# Patient Record
Sex: Male | Born: 1935 | Race: White | Hispanic: No | Marital: Married | Smoking: Former smoker
Health system: Southern US, Community
[De-identification: ages and names within clinical notes are randomized; demographics above are authoritative.]

## PROBLEM LIST (undated history)

## (undated) DIAGNOSIS — M1990 Unspecified osteoarthritis, unspecified site: Secondary | ICD-10-CM

## (undated) DIAGNOSIS — I10 Essential (primary) hypertension: Secondary | ICD-10-CM

## (undated) DIAGNOSIS — E119 Type 2 diabetes mellitus without complications: Secondary | ICD-10-CM

## (undated) DIAGNOSIS — E785 Hyperlipidemia, unspecified: Secondary | ICD-10-CM

## (undated) DIAGNOSIS — D649 Anemia, unspecified: Secondary | ICD-10-CM

## (undated) DIAGNOSIS — I2510 Atherosclerotic heart disease of native coronary artery without angina pectoris: Secondary | ICD-10-CM

## (undated) DIAGNOSIS — I219 Acute myocardial infarction, unspecified: Secondary | ICD-10-CM

## (undated) HISTORY — DX: Atherosclerotic heart disease of native coronary artery without angina pectoris: I25.10

## (undated) HISTORY — DX: Hyperlipidemia, unspecified: E78.5

## (undated) HISTORY — PX: BACK SURGERY: SHX140

## (undated) HISTORY — PX: APPENDECTOMY: SHX54

## (undated) HISTORY — PX: KNEE SURGERY: SHX244

---

## 1990-12-04 HISTORY — PX: CORONARY ARTERY BYPASS GRAFT: SHX141

## 2002-12-04 HISTORY — PX: CARDIAC CATHETERIZATION: SHX172

## 2002-12-07 ENCOUNTER — Inpatient Hospital Stay (HOSPITAL_COMMUNITY): Admission: EM | Admit: 2002-12-07 | Discharge: 2002-12-11 | Payer: Self-pay | Admitting: Emergency Medicine

## 2002-12-08 ENCOUNTER — Encounter: Payer: Self-pay | Admitting: Internal Medicine

## 2003-02-04 ENCOUNTER — Inpatient Hospital Stay (HOSPITAL_COMMUNITY): Admission: EM | Admit: 2003-02-04 | Discharge: 2003-02-06 | Payer: Self-pay | Admitting: *Deleted

## 2003-02-12 ENCOUNTER — Ambulatory Visit (HOSPITAL_COMMUNITY): Admission: RE | Admit: 2003-02-12 | Discharge: 2003-02-13 | Payer: Self-pay | Admitting: Cardiology

## 2009-12-31 ENCOUNTER — Ambulatory Visit: Payer: Self-pay | Admitting: Cardiovascular Disease

## 2010-01-03 ENCOUNTER — Telehealth: Payer: Self-pay | Admitting: Cardiovascular Disease

## 2010-02-07 ENCOUNTER — Inpatient Hospital Stay (HOSPITAL_COMMUNITY): Admission: RE | Admit: 2010-02-07 | Discharge: 2010-02-11 | Payer: Self-pay | Admitting: Neurosurgery

## 2010-03-31 ENCOUNTER — Encounter: Admission: RE | Admit: 2010-03-31 | Discharge: 2010-03-31 | Payer: Self-pay | Admitting: Neurosurgery

## 2010-06-07 ENCOUNTER — Encounter: Admission: RE | Admit: 2010-06-07 | Discharge: 2010-06-07 | Payer: Self-pay | Admitting: Neurosurgery

## 2010-06-27 ENCOUNTER — Ambulatory Visit: Payer: Self-pay | Admitting: Surgery

## 2011-01-03 NOTE — Progress Notes (Signed)
  Phone Note Outgoing Call   Call placed by: Jennifer Toothman LPN Call placed to: Patient Summary of Call: Patient notified per Dr. Ellyn Rubiano, lab results were OK.    

## 2011-02-26 LAB — CBC
HCT: 47.7 % (ref 39.0–52.0)
Hemoglobin: 16.2 g/dL (ref 13.0–17.0)
MCHC: 34 g/dL (ref 30.0–36.0)
MCV: 80.3 fL (ref 78.0–100.0)
RDW: 16.6 % — ABNORMAL HIGH (ref 11.5–15.5)

## 2011-02-26 LAB — BASIC METABOLIC PANEL
BUN: 24 mg/dL — ABNORMAL HIGH (ref 6–23)
CO2: 31 mEq/L (ref 19–32)
Calcium: 8.4 mg/dL (ref 8.4–10.5)
Chloride: 99 mEq/L (ref 96–112)
GFR calc Af Amer: >60 mL/min (ref >60)
GFR calc non Af Amer: >60 mL/min (ref >60)
Glucose, Bld: 132 mg/dL — ABNORMAL HIGH (ref 70–99)
Glucose, Bld: 143 mg/dL — ABNORMAL HIGH (ref 70–99)
Potassium: 3.8 mEq/L (ref 3.5–5.1)

## 2011-02-26 LAB — PROTIME-INR
INR: 1.03 (ref 0.00–1.49)
Prothrombin Time: 13.4 seconds (ref 11.6–15.2)

## 2011-02-26 LAB — SURGICAL PCR SCREEN: Staphylococcus aureus: NEGATIVE

## 2011-02-26 LAB — APTT: aPTT: 32 seconds (ref 24–37)

## 2011-02-26 LAB — TYPE AND SCREEN

## 2011-04-18 NOTE — Assessment & Plan Note (Signed)
[facility]                        [facility] OFFICE NOTE   NAME:[name], [name]                        MRN:          [id number]  DATE:12/31/2009                            DOB:          04-Apr-1935    CHIEF COMPLAINT:  Preoperative clearance for back surgery.   HISTORY OF PRESENT ILLNESS:  [name] is a 76 year old white male with  past medical history significant for coronary disease status post CABG  in 1992 and multiple subsequent PCIs in 2004, hypertension,  hyperlipidemia, who is presenting for a preoperative evaluation prior to  lumbar surgery to address spinal stenosis.  The patient states since his  last coronary interventions in 2004, he has been getting along quite  well.  Over the past year, he has had worsening lower back and left  lower extremity pain that was eventually diagnosed as being caused by  spinal stenosis. The patient states he has been very sedentary secondary  to the back and lower extremity pain.  However, he denies any episodes  of chest discomfort, shortness of breath, lower extremity edema,  dizziness or syncope.  The patient does state that for the past 2 weeks  he has had cold-like symptoms including an infrequent cough of brownish  sputum.  He has been compliant with all of his medications.   PAST MEDICAL HISTORY:  As above in HPI.  In addition, the patient has a  spinal stenosis.   SOCIAL HISTORY:  Quit using tobacco in 1991.  No alcohol.   FAMILY HISTORY:  Negative premature coronary disease.   ALLERGIES:  He states intolerance to all STATINS.   MEDICATIONS:  1. Aspirin 325 daily.  2. Carvedilol 3.125 b.i.d.  3. Hydrochlorothiazide 25 mg daily.  4. Fish oil.  5. Cinnamon.  6. Gabapentin.  7. Garlic.   REVIEW OF SYSTEMS:  As in HPI. The patient states that the pain in his  legs, especially his left leg has been very severe.  Other systems as in  HPI, otherwise negative.   PHYSICAL EXAMINATION:   VITAL SIGNS:  The patient's blood pressure is  136/65, pulse is 57, sating 98% on room air, and he weighs 246 pounds.  GENERAL:  No acute distress.  HEENT:  Normocephalic and atraumatic.  NECK:  Supple.  There is no JVD in the seated position.  There are no  carotid bruits.  HEART:  Regular rate and rhythm without murmur, rub, or gallop.  LUNGS:  Clear bilaterally.  ABDOMEN:  Soft, nontender, and nondistended.  EXTREMITIES:  Without edema.  SKIN:  Warm and dry.  PSYCHIATRIC:  The patient is appropriate, but appears somewhat anxious  about the level of pain he is experiencing and the rapidity with the  surgery that we performed.   LABORATORY DATA:  EKG taken today in clinic independently reviewed by  myself demonstrates normal sinus rhythm with a right bundle-branch  block.  There is ST-segment depression in II and AVF as well as V5 and  V6.  This depression occurs mainly within 80-120 msec after the J-point  and T-waves are upright.  This is new compared  with the EKG dated March  2004.  Review of the patient's ABI is dated May 2010, right ABI was  1.13, left ABI was 1.02.   ASSESSMENT:  A 77 year old white male with known coronary disease who is  presenting for preoperative clearance.  He does not appear to be having  any symptoms of angina.  However, his EKG is somewhat concerning for  ischemia as he does have significant ST depression, 80 msec after the J-  point.   PLAN:  We will proceed with a Lexiscan Cardiolite in order to help risk  stratify the patient for his cardiovascular procedure.  He should  continue on his current medications.  His blood pressure is under good  control and his cholesterol is being followed by Dr. Modesto Charon.  We  will contact the patient once the results of his stress test are  obtained as well as sending a letter to Dr. Wynetta Emery after contacting the  patient.     Brayton El, MD  Electronically Signed    SGA/MedQ  DD: 12/31/2009  DT:  01/01/2010  Job #: [id number]

## 2011-04-18 NOTE — Assessment & Plan Note (Signed)
OFFICE VISIT   [name], [name]  DOB:  10-Apr-1935                                       06/27/2010  XBJYN#:[id number]   REASON FOR VISIT:  Left ankle and foot pain.   HISTORY:  This is a 76 year old gentleman that I am seeing at the  request of Dr. Wynetta Emery for evaluation of left ankle and foot pain.  The  patient states that he has been having pain in his left foot and ankle  for at least 5 years.  He describes it as a sharp pain which begins in  his ankle and shoots out to his foot and toes.  He states that his ankle  and foot are numb all the time.  Walking does make it worse.  He did  have a lumbar back injection in August, and he said it temporarily went  away.  He is status post lumbar back surgery on March 7th which  alleviated the radiculopathy and pelvic and thigh pain; however, he  still is left with residual ankle and foot pain.  The patient does not  endorse any ulcers or nonhealing wounds.   The patient suffers from hypertension, hypercholesterolemia, both of  which are medically managed.  He has had a heart attack in 2005 and has  had coronary stents the past.  He has also had a coronary bypass graft.   REVIEW OF SYSTEMS:  GENERAL:  Negative for fevers, chills, weight gain,  weight loss.  VASCULAR:  Positive for pain in the legs with walking and clots in his  veins.  CARDIAC:  Negative.  GI:  Negative.  NEURO:  Is as above.  PULMONARY:  Negative.  HEME:  Negative.  GU:  Negative.  ENT:  Negative.  MUSCULOSKELETAL:  Positive for arthritis, joint pain and muscle pain.  PSYCH:  Negative.  SKIN:  Negative.   PAST MEDICAL HISTORY:  Hypertension, hypercholesterolemia, history of MI  in 2005.   PAST SURGICAL HISTORY:  CABG, knee replacement, coronary stents and back  surgery.   SOCIAL HISTORY:  He is married with 2 children.  He is retired.  History  of smoking, quit in October 1991.  Does not drink.   ALLERGIES:  See chart.   MEDICATIONS:  See chart.   PHYSICAL EXAMINATION:  Heart rate 56, blood pressure 132/60, O2 sat 90%.  General:  He is well-appearing, in no distress.  HEENT:  Within normal  limits.  Lungs:  Clear bilaterally.  Cardiovascular:  Regular rate and  rhythm.  No carotid bruits.  Musculoskeletal:  No cyanosis.  Neuro:  No  focal deficits.  Skin:  Without rash.  Extremities:  The patient has 2+  right femoral pulse, 1+ left femoral pulse.  He has palpable dorsalis  pedis and posterior tibial arteries on the left.   DIAGNOSTIC STUDIES:  CT scan performed reveals a 3.2-cm infrarenal  aortic aneurysm and a left common iliac dissection.   Duplex study:  The ankle-brachial index on the right is 1.0, on the left  is 0.9.  Monophasic waveforms on the left.   ASSESSMENT/PLAN:  Abdominal aortic aneurysm and left iliac dissection.   Based on the patient's symptomatology and findings of dissection on CT  scan, I do not feel that his dissection is in any way contributing to  his complaints.  His complaints go more along the  lines of arthritic or  neuropathic.  In addition, he has palpable pedal pulses.  I have  discussed these findings with the patient and have reassured him that  his symptoms are not due to arterial pathology.  He does have an  abdominal aortic aneurysm which will need to be followed on a yearly  basis.  I have scheduled him to come back to see me in 1 year with an  abdominal ultrasound.     Jorge Ny, MD  Electronically Signed   VWB/MEDQ  D:  06/27/2010  T:  06/28/2010  Job:  [id number]   cc:   Donalee Citrin, M.D.

## 2011-04-21 NOTE — Cardiovascular Report (Signed)
NAMEJAHMAD, [name]                           ACCOUNT NO.:  [id number]   MEDICAL RECORD NO.:  [id number]                   PATIENT TYPE:  INP   LOCATION:  2926                                 FACILITY:  [facility]   PHYSICIAN:  Charlies Constable, M.D. [facility]              DATE OF BIRTH:  [birth year]   DATE OF PROCEDURE:  12/08/2002  DATE OF DISCHARGE:                              CARDIAC CATHETERIZATION   CLINICAL HISTORY:  The patient is 76 years old and had bypass surgery  performed at [facility] 12 years ago and has not had any follow-up since that  time.  He presented to [facility] with chest pain, ST-T changes, and  enzymes consistent with a non-Q-wave myocardial infarction.  He was started  on Integrilin and heparin and transferred to [place] for further evaluation and  treatment.  His platelet count was 125,000 on admission to [facility]  and was 93,000 this morning prior to the procedure.   PROCEDURE:  The procedure was performed via the right femoral artery with  arterial sheath and 6 [place] ________ coronary catheters.  _____________ of  arterial branches was performed and nonopaque contrast was used.  After  completion of the diagnostic study, I made a decision to proceed with  multivessel coronary intervention.   Because of the platelet count was low and because of the possibility of  Integrilin-induced or heparin-induced thrombocytopenia, we made a decision  to stop the Integrilin and treated the patient with Angiomax bolus and  infusion.   We first approached the right coronary artery.  We used a JR4 6 [place]  guiding catheter with side holes and a short BNW wire.  We crossed the  lesion in the right coronary artery without difficulty.  We predilated with  a 2.5 x 20 mm Quantum Maverick, performing two inflations up to 10  atmospheres for 30 seconds.  We then deployed a 3.0 x 28 mm Cypher stent,  deploying this with one inflation of 12 atmospheres for 30 seconds and a  second inflation of 16 atmospheres for 30 seconds with the balloon inside  the distal edge.  We then post dilated with a 3.5 x 8 mm Quantum Maverick in  the proximal portion of the stent where we felt it was not completely  opposed and performed one inflation of 16 atmospheres for 30 seconds.   We then approached the circumflex artery.  There were two tandem lesions and  there was segmental disease which crossed sub-branches of the marginal  branch.  The second sub-branch had a 50% ostial lesion and came off of an  angle and we felt that stenting across this would probably compromise the  side branch and be difficult to treat.  Our initial approach was to try and  treat the more severe distal lesion.  We used a 2.5 x 20 mg Quantum Maverick  and performed one inflation of 10 atmospheres for  30 seconds and then we  pulled the balloon back and performed another inflation of 10 atmospheres  for 30 seconds which crossed the two sub-branches.  This resulted in  improvement, but there was a split distal to the two sub-branches and we  felt we had to stent the vessel.  We used a 2.25 x 20 mm express and  deployed this with one inflation of 10 atmospheres for 30 seconds and a  second inflation with the balloon inside the proximal edge at 14 atmospheres  for 30 seconds.  We then dilated the lesion just after the lesion of the  second sub-branch with the 2.25 balloon, performing one inflation of 10  atmospheres for 30 seconds.  Repeat diagnostic studies were then performed  through the guiding catheter.   The patient was extremely restless and agitated during the procedure because  of back pain.  He received a total of 200 mg of fentanyl and 3 mg of Versed.  Intermittently his saturations went down due to the sedation and we had to  arouse him.  This made the procedure extremely difficult and we did not feel  like we could do the saphenous vein graft at the same setting, so we elected  to defer this  to another time.   We closed the right femoral artery with Perclose at the end of the  procedure.   CONCLUSION:  1. Status post coronary artery bypass graft surgery 12 years ago at [facility]  2. Severe native vessel disease with total occlusion of the left anterior     descending, 95% stenosis in the proximal right coronary artery, 95%     stenosis in the marginal branch of the circumflex artery with 70%     stenosis in the mid portion of the marginal branch of the circumflex     artery, patent left internal mammary artery graft to the left anterior     descending, and an occluded vein graft to the diagonal branch of the left     anterior descending.  3. Anterolateral hypokinesis with overall good left ventricular function     with an ejection fraction of 50%.  4. Successful stenting of the proximal right coronary artery lesion with     improvement in narrowing from 95% to less than 10%.  5. Successful stent of the lesion in the distal circumflex marginal vessel     and improvement in narrowing from 95% to 0% and successful angioplasty of     the lesion in the mid portion of the marginal branch of the circumflex     artery, improving the narrowing from 70% to 40%.   DISPOSITION:  The patient was returned to ___________ for further  observation.  Will plan to bring the patient back in two days on Wednesday  for probable intervention on the vein graft to the diagonal branch depending  on its status at that time and relook at the circumflex artery.  We had  somewhat of a suboptimal result in the mid portion of the circumflex artery  just after the sub-branch, but we felt this was preferable at this time to  stenting across the sub-branch.                                               Charlies Constable, M.D. [facility]  BB/MEDQ  D:  12/08/2002  T:  12/08/2002  Job:  [id number]   cc:   [name], M.D. [facility]   Cardiopulmonary Lab

## 2011-04-21 NOTE — Letter (Signed)
January 10, 2010     RE:  [name], [name]  MRN:  [id number]  /  DOB:  04-26-1935   To Whom It May Concern:   This letter is regarding the preoperative evaluation of [name]  prior to lumbar fusion surgery.  [name] does have a history of  coronary disease but he is currently not having any symptoms that are  concerning for angina.  A nuclear stress test was performed and it only  showed a mild abnormality that may have been secondary to soft tissue  attenuation.  Overall this was a low risk study and the patient should  be considered medically optimized from a cardiac perspective for his  lumbar fusion surgery.  Please contact my office with any questions or  concerns.    Sincerely,      Brayton El, MD  Electronically Signed    SGA/MedQ  DD: 01/10/2010  DT: 01/10/2010  Job #: [id number]

## 2011-04-21 NOTE — H&P (Signed)
[name], [name]                           ACCOUNT NO.:  [id number]   MEDICAL RECORD NO.:  [id number]                   PATIENT TYPE:  INP   LOCATION:  2926                                 FACILITY:  [facility]   PHYSICIAN:  [name], M.D. [facility]           DATE OF BIRTH:  [birth year]   DATE OF ADMISSION:  12/07/2002  DATE OF DISCHARGE:                                HISTORY & PHYSICAL   Patient is currently without a room.   [name] is a 76 year old gentleman referred from [facility]  because of an acute, non-ST-segment-elevation myocardial infarction.   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  He has had  no significant intercurrent illnesses.  He did have a knee replacement done  two years ago.  He did not see a cardiologist at that time.   This evening, he awakened from sleep with severe chest pain described as  involving his right shoulder, the right side of his neck, into the right  side of his jaw associated with diaphoresis, shortness of breath and some  nausea.  He went to [facility] where an electrocardiogram  demonstrated ST-segment depression across his precordium and a segment of  right bundle branch block, left anterior fascicular block, and arrangements  were made for transfer.  He was treated initially with heparin and aspirin.  Integrelin was added.  He did receive Lopressor.  Severe hypertension noted  on arrival abated in conjunction with morphine, nitroglycerin and the beta  blockers.  This was associated with resolution of the ST-segment changes,  and en route he received further morphine with resolution of his chest pain.   He is limited to less than 100 or 200 feet because of shortness of breath.  He does not have claudication.  He does not have orthopnea, nocturnal  dyspnea or peripheral edema.  He has had no palpitations and no syncope.   He does not smoke.  He does not know if he has hypertension.  He does not  take cholesterol medication.   REVIEW OF SYSTEMS:  Noncontributory.  There have been no GI symptoms, no  constitutional symptoms.  He does have nocturnal cramping in his legs.   SOCIAL HISTORY:  He is married.  He lives with his grandson who assists him  in his work.  He does not use alcohol, cigarettes or recreational drugs.   SURGICAL HISTORY:  Notable for coronary artery bypass grafting and knee  replacement.   MEDICATIONS:  Aleve p.r.n.   ALLERGIES:  He has no known drug allergies.   PHYSICAL EXAMINATION:  GENERAL:  He is an older Caucasian male in no acute  distress.  VITAL SIGNS:  His blood pressure was 127/74 with a heart rate of 54.  His  respirations were 14.  He was in no acute distress.  HEENT:  His HEENT examination demonstrated no scleral icterus.  NECK:  The neck veins were flat.  The carotids were brisk and full  bilaterally without bruits.  There was no lymphadenopathy.  BACK:  The back was without kyphosis or scoliosis.  LUNGS:  The lungs were clear.  HEART:  The heart sounds were regular without murmurs or gallops.  ABDOMEN:  The abdomen was soft with active bowel sounds without midline  pulsation, hepatomegaly or tenderness.  EXTREMITIES:  The femoral pulses were 2+ bilaterally.  Distal pulses were  intact.  There was no clubbing, cyanosis or edema.  NEUROLOGIC:  His neurological examination was grossly normal.  SKIN:  His skin was warm and dry.   STUDIES:  An electrocardiogram at this point demonstrated sinus rhythm at 54  with intervals of 0.15/0.15/0.45 with an axis that was leftward at about -  30.  There was a QS in lead V1 and a rapid transition in lead II.  There was  no Q wave and the SG segment notable on the previous electrocardiograms had  resolved.   LABORATORY DATA FROM OUTSIDE HOSPITAL:   Creatinine 1.1.  Glucose 96.  Potassium 4.2.  CK was 320 with an MB of 14.2 and troponin of 2.1, both  elevated.   IMPRESSION:  1. Non-ST-segment-elevation myocardial infarction.  2. Known coronary artery disease     A. Status post coronary artery bypass grafting 12 years ago at [facility].     B. Unknown ejection fraction.  3. Class II-III congestive symptoms.  4. Wide QRS secondary to right bundle branch block with left axis deviation.  5. Status post knee replacement.   [name] has a non-ST-segment-elevation myocardial infarction in the  context of prior bypass surgery.  His pain is currently resolved with the  multiple anticoagulants that have been initiated.  Further antiplatelet  therapy will be started and risk factor modification will need to be  undertaken.  The fact that he is currently pain free will allow [place] to  undertake his catheterization more electively.  I reviewed this with him.   PLAN:  1. Continue his 2b3a, heparin and aspirin.  2. Begin Plavix.  3. Fasting lipid profile with initiation of Lipitor.  4. Catheterization in the A.M.  5. Begin statin therapy and ACE inhibitors.                                               [name], M.D. [facility]    SCK/MEDQ  D:  12/07/2002  T:  12/07/2002  Job:  [id number]

## 2011-04-21 NOTE — Discharge Summary (Signed)
[name], [name]                           ACCOUNT NO.:  [id number]   MEDICAL RECORD NO.:  [id number]                   PATIENT TYPE:  INP   LOCATION:  2036                                 FACILITY:  [facility]   PHYSICIAN:  [name], M.D. [facility]           DATE OF BIRTH:  01-29-1935   DATE OF ADMISSION:  12/07/2002  DATE OF DISCHARGE:  12/11/2002                           DISCHARGE SUMMARY - REFERRING   PROCEDURES:  1. Cardiac catheterization.  2. Coronary arteriogram.  3. Left ventriculogram.  4. Percutaneous transluminal coronary angiography and stent x 2.  5. Percutaneous transluminal coronary angiography and stent to vein graft.   HISTORY OF PRESENT ILLNESS:  The patient is a 76 year old male with a  history of aortocoronary bypass surgery 12 years ago at [facility].  He has not  had regular cardiology since that time.  On the day of admission, he was  awaken from sleep with severe chest pain that radiated into his right  shoulder, right neck, and right side of his jaw.  This was associated with  diaphoresis, shortness of breath, and nausea.  He went to [facility]  where his EKG was abnormal.  He was treated with heparin, Integrilin, and  Lopressor.  He was transferred to [facility] urgently  for cardiac catheterization and further evaluation.   HOSPITAL COURSE:  The patient had a cardiac catheterization on December 08, 2002, that showed a normal left main and an LAD that was totaled proximally.  The circumflex was patent, but the OM was a large vessel with a 70% mid and  then a 90% distal stenosis.  The RCA had a 95% stenosis as well.  His EF was  approximately 50%.  He had PTCA and stent to the OM, reducing the first  stenosis to approximately 40% with PTCA and then had a stent to the more  distal OM, reducing that stenosis from 95% to 0.  Additionally, he had PTCA  and stent to the RCA, reducing that stenosis from 95% to less than 10%.   The  LIMA to LAD was okay, but the SVG to diagonal was totaled.  Upon further  evaluation, it was felt that he would benefit from percutaneous intervention  to the diagonal, so this was scheduled for December 10, 2002.   The patient had percutaneous intervention to the SVG to diagonal, reducing  that stenosis from 100% to less than 10%.  He tolerated the procedure well,  was seen by cardiac rehabilitation, and ambulated without chest pain post  procedure.  His groin remained stable.  He had no significant problems with  this.  From a cardiac standpoint, he was doing well.   One of the issues that evolved during the patient's stay was  thrombocytopenia.  His platelets were slightly low at 125 upon arrival at  [facility].  However, with the medications he was given, his platelet  count was 93 upon arrival at [facility].  The heparin  was discontinued and antibody panel was sent.  His platelets did recover  somewhat and he was back up to what is apparently his baseline of 125 by the  time he left the hospital.  The platelet antibody study was negative.  He  had received aspirin, as well as heparin and Integrilin.   The patient had his aspirin reduced to 81 mg a day, but was continued on the  Plavix.  He did not receive any further heparin or Integrilin.  He was on  Angiomax between procedures.  He tolerated this well.  Any further  evaluation can be done as an outpatient.   With the improvement in the patient's cardiac status and with no other  medical problems, the patient was considered stable for discharge on December 11, 2002.   LABORATORY VALUES:  Hemoglobin 14.5, hematocrit 41.5, WBC 9.4, platelets 125  at discharge.  Sodium 135, potassium 4.1, chloride 101, CO2 25, BUN 18,  creatinine 1.1, glucose 104.  The CK-MB peaked at 419/29.7 with a troponin I  peak of 2.61.  Total cholesterol 154, triglycerides 402, HDL 25, LDL not  calculated.   CONDITION ON  DISCHARGE:  Improved.   DISCHARGE DIAGNOSES:  1. Non-ST segment elevation myocardial infarction, status post percutaneous     transluminal coronary angiography to the mid obtuse marginal,     percutaneous transluminal coronary angiography and Cypher stent to the     distal obtuse marginal with percutaneous transluminal coronary     angiography and Express stent to the right coronary artery, as well as     percutaneous transluminal coronary angiography and Express stent to the     saphenous vein graft to diagonal.  2. Status post aortocoronary bypass surgery in 1991 with left internal     mammary artery to left anterior descending and saphenous vein graft to     diagonal.  3. Thrombocytopenia.  Heparin antibody negative.  Patient also on Integrilin     this admission.  4. Mildly decreased left ventricular function with an ejection fraction of     50% by catheterization this admission.  5. Hypertension.  6. Dyslipidemia with hypertriglyceridemia and low high-density lipoprotein.  7. Status post knee replacement.  8. Status post percutaneous intervention on the left anterior descending in     1991.   ACTIVITY:  His activity level is to include no driving for a week and no  sexual or strenuous activity until cleared by M.D.   DIET:  He is to stick to a low-fat diet.   WOUND CARE:  He is to call the office for problems with the catheterization  site.   FOLLOW-UP:  He is to follow up with Harl Bowie, M.D., and see him within  two weeks.  He is to obtain a primary care physician.   DISCHARGE MEDICATIONS:  1. Plavix 81 mg q.d.  2. Plavix 75 mg q.d. for three months.  3.     TriCor 160 mg q.d.  4. Toprol XL 50 mg q.d.  5. Nitroglycerin p.r.n.     [name], P.A. [facility]                  [name], M.D. [facility]    RG/MEDQ  D:  12/11/2002  T:  12/11/2002  Job:  [id number]   cc:   Harl Bowie, M.D.  [address] [place]  [place] [address]  Fax: [phone]   Charlies Constable,  M.D. [facility]  [address]  [place]  [place] [address]

## 2011-04-21 NOTE — Discharge Summary (Signed)
[name], [name]                           ACCOUNT NO.:  [id number]   MEDICAL RECORD NO.:  [id number]                   PATIENT TYPE:  INP   LOCATION:  6523                                 FACILITY:  [facility]   PHYSICIAN:  Vida Roller, M.D.                DATE OF BIRTH:  17-Jul-1935   DATE OF ADMISSION:  02/04/2003  DATE OF DISCHARGE:  02/06/2003                           DISCHARGE SUMMARY - REFERRING   PROCEDURE:  1. Cardiac catheterization.  2. Coronary arteriogram.  3. Left ventriculogram.  4. Percutaneous transluminal coronary angioplasty and Cypher stent to one     vessel.   HOSPITAL COURSE:  [name] is a 76 year old male with known coronary  artery disease. He went to [facility] for evaluation of chest pain  and, because he had a history of recent stents to his RCA and OM as well as  left ventricular dysfunction, was transferred to [facility] for  further evaluation and treatment.   [name] enzymes were negative for MI. He had a cardiac catheterization  performed on 02/05/03. It showed a patent left main and a LAD that was  totalled in the proximal portion. The LIMA to the LAD was patent and  supplied a distal vessel. The SVG to diagonal was patent, and it had a 70%  stenosis but no restenosis at the previous intervention site. The circumflex  was patent, but the OM had 95% end stent restenosis. This was treated with  PTCA, and the stenosis was reduced to less than 30%. A branch of the obtuse  marginal had a 70% stenosis ostially, and the RCA had no end-stent  restenosis, but there was a 30% stenosis distal to the prior intervention  site. He had inferior hypokinesis with an EF of approximately 50%.   [name] tolerated the procedure well, and the sheath was removed without  difficulty. It was felt that he needed percutaneous intervention on the vein  graft to the diagonal but this could be done electively.   The next day, [name] was  without chest pain or shortness of breath, and  his groin was without hematoma or ecchymosis. He was seen by cardiac rehab  and ambulated, receiving restrictions on MI and percutaneous intervention.  He was walking independently without chest pain or shortness of breath and  considered stable for discharge on 02/06/03.   LABORATORY DATA:  Hemoglobin 14.8, hematocrit 42.7, WBCs 7.8, platelets 112.  Sodium 137, potassium 3.9, chloride 102, CO2 28, BUN 19, creatinine 1.3,  glucose 88.   DISCHARGE CONDITION:  Improved.   DISCHARGE DIAGNOSES:  1. Unstable anginal pain, status post percutaneous transluminal coronary     angioplasty to the obtuse marginal for end-stent restenosis this     admission.  2. History of aortocoronary bypass surgery at [facility] or 1991 with left     internal mammary artery and saphenous vein graft to diagonal.  3. Status post percutaneous transluminal coronary angioplasty and stent to     the right coronary artery and obtuse marginal in 1/04 secondary to     myocardial infarction.  4. History of percutaneous transluminal coronary angioplasty of the     saphenous vein graft to diagonal 1/04.  5. History of thrombocytopenia.  6. Hypertension.  7. Hyperlipidemia.  8. Status post left knee replacement 2002.  9. Rheumatoid arthritis.   DISCHARGE INSTRUCTIONS:  Activity level is to include no strenuous activity  until after his next exam. He is to stick to a low fat and salt diet. He is  to call the office with problems with the catheterization site. He is to  return on 3/11 at 9:30 a.m. He is to follow up with Dr. Tomie China and Dr.  Pollie Friar.   DISCHARGE MEDICATIONS:  1. Coated aspirin 325 mg q.d.  2. Plavix 75 mg q.d.  3. Imdur 60 mg q.d.  4. Toprol XL 50 mg q.d.  5. Nitroglycerin p.r.n.  6. Tricor 160 mg q.d.  7. Altace 2.5 mg q.d.     [name], P.A. [facility]                  Vida Roller, M.D.    RG/MEDQ  D:  03/26/2003  T:  03/27/2003  Job:   [id number]   cc:   Aundra Dubin. Revankar, M.D.  [address]  [place]  [place] [address]  Fax: [phone]

## 2011-04-21 NOTE — Cardiovascular Report (Signed)
NAMEHOWELL, [name]                           ACCOUNT NO.:  [id number]   MEDICAL RECORD NO.:  [id number]                   PATIENT TYPE:  INP   LOCATION:  6523                                 FACILITY:  [facility]   PHYSICIAN:  Arturo Morton. Riley Kill, M.D. [facility]         DATE OF BIRTH:  Nov 29, 1935   DATE OF PROCEDURE:  02/05/2003  DATE OF DISCHARGE:  02/06/2003                              CARDIAC CATHETERIZATION   INDICATIONS FOR PROCEDURE:  The patient is a pleasant 76 year old gentleman  who presented with recurrent symptoms.  He had non-ST elevation MI by  biomarkers.  The current study was done to assess coronary anatomy.  Importantly, the patient, in January, underwent stenting of the saphenous  vein graft to the diagonal, stenting of the native distal OM, and stenting  of the right coronary artery.  The right coronary artery had a drug-eluting  stent placed while the circumflex had a non-drug-eluting stent as did the  vein graft.   PROCEDURE:  1. Left heart catheterization.  2. Selective coronary arteriography.  3. Selective left ventriculography.  4. Saphenous vein graft angiography x1.  5. Selective left internal mammary angiography x1.  6. Percutaneous coronary intervention of the circumflex coronary artery.   DESCRIPTION OF PROCEDURE:  The patient was brought to the catheterization  lab and prepped and draped in the usual fashion.  Because of a history of  possible heparin-induced thrombocytopenia, the patient was given bivalirudin  and was on a bivalirudin drip on arrival.  The ACT was in the 190 range, and  as a result, the patient was subsequently bolused with 0.5 mg/kg and placed  on a 1.75 mg/kg drip for percutaneous intervention.  Percutaneous  intervention was performed using a JL4 guiding catheter with sideholes.  The  lesion was crossed with a 0.014 Hi-Torque Floppy wire.  We dilated  throughout the area of the previously placed stent, first with a 2.25 x 20-  mm  balloon.  Secondly, we placed a 2.5-mm balloon and dilatations within the  stent were done up to about 12 atmospheres.  There was marked improvement in  the appearance of the artery.  There was some continued modest narrowing  just at the site of a large sub branch of the marginal that itself had about  70% narrowing, and it was felt that higher inflations would likely  compromise this branch.  There was marked improvement in the appearance of  this artery.  We elected to defer any intervention of the diagonal vein  graft at the present time because of the length of the procedure and the  patient's back pain.  ACT during the procedure was appropriate.  A 6-French  femoral sheath was exchanged for a 7-French femoral sheath.  The patient was  taken to the holding area in satisfactory clinical condition.   HEMODYNAMIC DATA:  1. Central aorta 147/87, mean 112.  2. Left ventricular pressure 146/17/34.  3. No  aortic to left ventricular gradient on pullback across the aortic     valve.   ANGIOGRAPHIC DATA:  1. Ventriculography is performed in the RAO projection.  Ejection fraction     would be estimated in the range of 50%.  There is perhaps mild inferior     hypokinesis.  2. The left main coronary artery is free of critical disease.  3. The left anterior descending artery is totally occluded.  4. The left internal mammary to the distal LAD is widely patent with brisk     flow and without significant compromise.  5. The saphenous vein graft to the diagonal demonstrates no restenosis at     the stent site; however, proximal to the stent site is a 70% area of     segmental narrowing.  This is progressive from the previous study.  6. The native circumflex provides an AV circumflex branch which is free of     critical disease.  There is a first marginal sub branch followed by a     bifurcation in which there is a 70% sub branch ostial stenosis as well as     a long area of diffuse in-stent  restenosis at the previously placed     stent.  After multiple dilatations, the overall stent is reduced to about     30% residual luminal narrowing throughout with a typical post stent     dilatation appearance.  7. The right coronary artery is a large caliber vessel.  At the previous     site of DES placement, there is no evidence of restenosis.  There is a     mid 30% stenosis.  The distal vessel provides a bifurcating posterior     descending and a posterolateral branch.   CONCLUSIONS:  1. Preserved overall left ventricular function with mild inferior     hypokinesis.  2. Continued patency of the internal mammary to the left anterior descending     artery.  3. Continued patency of the right coronary artery at the previous site of     drug-eluting stent placement.  4. Continued patency of the saphenous vein graft to the diagonal with     progression of disease proximal to the stent site.  5. High-grade in-stent restenosis of a small long stent in the distal     circumflex with successful repeat balloon dilatation.   DISCUSSION:  I reviewed the films with Dr. Juanda Chance.  This is a fairly  difficult situation.  Fortunately, the internal mammary to the LAD is open  as is the native right coronary.  Several branches of the circumflex are  patent.  Potentially down the road, a stent could be performed with a DES  stent although the size is a bit of an issue.  Re-do surgery could be  considered, but importantly, re-do surgery would be predominantly for a  modest-sized diagonal, and for smaller branches of a circumflex.  As noted,  the LAD LIMA system remains intact as does the native right  coronary.  Continued conservative management without surgery would be  favored given the risks of re-do CABG compared to the expected benefits.  I  have explained this to the family in detail.  We will make a decision  regarding his other vessels.  Arturo Morton. Riley Kill, M.D. [facility]    TDS/MEDQ  D:  02/05/2003  T:  02/06/2003  Job:  [id number]   cc:   Aundra Dubin. Revankar, M.D.  [address]  [place]  [place] [address]  Fax: [phone]   Harl Bowie, M.D.  [address]  [place]  [place] [address]  Fax: [phone]   Cardiac Catheterization Lab

## 2011-04-21 NOTE — H&P (Signed)
NAMETYHEEM, BOUGHNER                           ACCOUNT NO.:  [id number]   MEDICAL RECORD NO.:  [id number]                   PATIENT TYPE:  INP   LOCATION:  3314                                 FACILITY:  [facility]   PHYSICIAN:  Vida Roller, M.D.                DATE OF BIRTH:  [birth year]   DATE OF ADMISSION:  02/04/2003  DATE OF DISCHARGE:                                HISTORY & PHYSICAL   ADMISSION DIAGNOSIS:  Unstable angina.   HISTORY OF PRESENT ILLNESS:  The patient is a 76 year-old gentleman with  known coronary artery disease status post bypass surgery 12 years ago at  [facility] who presented in early January 2004 with a non-ST  segment elevation myocardial infarction to [facility].  He was  transferred to [facility] where he had coronary artery angiogram  which showed evidence of severe native coronary artery disease as well as  vein graft disease.  He underwent a series of percutaneous interventions to  include stent placement in his distal right coronary artery, stent placement  in his distal circumflex coronary artery and stent placement in the  saphenous vein graft to his first diagonal.  At that time he was noted to  have mildly depressed left ventricular ejection fraction at 50% with  anterior hypokinesis consistent with an old anterior wall myocardial  infarction.  He did well on medical therapy after his intervention until  approximately three days ago when he began to experience substernal chest  discomfort.  He describes the chest discomfort as identical to his previous  angina, a squeezing sensation which is severe in the center of his chest  which radiates to his neck and up into his shoulders associated with severe  shortness of breath.  He first noticed this after working; he installs  garage doors, and this pain was relieved with a single sublingual  nitroglycerin.  However, later that evening the pain recurred and he needed  a  second sublingual nitroglycerin.  He was seen in the office in [facility] where an additional nitrate was added, Imdur 60 mg a day.  He  took this for two days, but continued to have intermittent substernal chest  discomfort, sometimes at rest and sometimes with exertion which required  multiple sublingual nitroglycerin.  Finally this morning about 1:00 in the  morning, he had the onset of severe chest discomfort which responded only  intermittently to sublingual nitroglycerin and finally he presented to the  emergency department at [facility] where he was started on a  nitroglycerin drip which resolved his discomfort.  He was anticoagulated  with heparin and transported via [facility] to [facility] for  definitive therapy.   PAST MEDICAL HISTORY:  1. Coronary artery bypass grafting.  2. Hypertension.  3. Hyperlipidemia.  4. History of heparin induced thrombocytopenia seen on the last percutaneous     intervention  which required the use of Angiomax as opposed to heparin.  5. Status post a knee replacement a number of years ago for osteoarthritis.   MEDICATIONS ON ADMISSION:  1. Aspirin 81 mg a day.  2. Toprol XL 50 mg a day.  3. TriCor 160 mg a day.  4. Nitroglycerin as he needs it.  5. Imdur 60 mg a day.  6. Plavix 75 mg a day.   SOCIAL HISTORY:  He is married and lives in  [place]. He does not drink and  he does not smoke.   FAMILY HISTORY:  His mother is still alive at age 66.  His father died at  age 42 of a myocardial infarction.  He has one sister with diabetes and  coronary artery disease who is alive.   REVIEW OF SYMPTOMS:  Noncontributory.   PHYSICAL EXAMINATION:  GENERAL:  He is a mildly obese white male in no  apparent distress who is alert and oriented times four.  VITAL SIGNS:  Blood pressure 114/70, heart rate 72, respirations 14 and he  is afebrile.  HEENT:  Examination of the head, ears, eyes, nose and throat is  unremarkable.  The  neck is supple with no jugular venous distention or  carotid bruits.  The thyroid is in the midline.  CHEST:  Clear to auscultation bilaterally.  CARDIAC EXAM:  Non-displaced point of maximal impulse with no lifts or  thrills. The first and second heart sounds are normal.  There is no third or  fourth heart sound.  ABDOMEN:  Soft and nontender with normal active bowel sounds.  No  hepatosplenomegaly.  LOWER EXTREMITIES:  Without cyanosis, clubbing or edema.  Pulses are full in  his groins with no notable bruit. His lower extremity pulses are 1+ and  palpable throughout with no evidence of bruit.  NEUROLOGIC EXAM:  Nonfocal.  MUSCULOSKELETAL EXAM:  Unremarkable.   His electrocardiogram reveals a normal sinus rhythm at a rate of 63 with a  biphasic P wave consistent with left atrial enlargement. There is left axis  deviation which appears to be old, but there appears to be a new right  bundle branch block with a relatively large slurred R prime wave in V1  consistent with mild right ventricular strain pattern.  There are no acute  ST-T wave changes other than the new right bundle branch block.   LABORATORY DATA:  BUN 24, creatinine 1.2, potassium 4.0, glucose 123.  The  initial set of Troponin's show a CK of 167 with a Troponin of 1.8.  INR is  1.0 with a PTT of 26.4.  Hemoglobin and hematocrit are 15.0 and 45.0, white  blood cell count 6.4 and platelets are 126,000.   ASSESSMENT:  1. Non-ST segment elevation myocardial infarction, recurrent, likely     secondary to his well established known coronary artery disease.  He is     currently pain free on intravenous nitroglycerin and he is anticoagulated     with heparin.  2. Heparin induced thrombocytopenia.  We will discontinue his heparin and     switch him over to Angiomax for his anticoagulation.  3. Hypertension which is reasonably well controlled on medications. 4. Hyperlipidemia which we will assess with a fasting set of lipids  tomorrow     morning.  5. Mild left ventricular dysfunction.  He is currently not on an ACE     inhibitor and consideration may be to start a low dose of an ACE  inhibitor; we will use Altace 2.5 mg once a day.  6. Knee surgery.   PLAN:  Our plan is to take him to the catheterization laboratory tomorrow  morning to reassess the significant coronary lesions he previously had and  we will pursue revascularization strategies as appropriate for the lesions  that we find.                                                Vida Roller, M.D.   JH/MEDQ  D:  02/04/2003  T:  02/04/2003  Job:  [id number]

## 2011-04-21 NOTE — Discharge Summary (Signed)
NAMEHAN, VEJAR                           ACCOUNT NO.:  [id number]   MEDICAL RECORD NO.:  [id number]                   PATIENT TYPE:  OIB   LOCATION:  6522                                 FACILITY:  [facility]   PHYSICIAN:  Arturo Morton. Riley Kill, M.D. [facility]         DATE OF BIRTH:  05-05-1935   DATE OF ADMISSION:  02/12/2003  DATE OF DISCHARGE:  02/13/2003                           DISCHARGE SUMMARY - REFERRING   PROCEDURES:  1. Cardiac catheterization.  2. Coronary arteriogram.  3. Left ventriculogram.  4. Percutaneous transluminal coronary angioplasty and expressed stent to the     saphenous vein graft.   HOSPITAL COURSE:  The patient is a 76 year old male with known coronary  artery disease who had bypass surgery in 1992.  He had an ST segment  elevation infarction in January of 2004 and at that time had a series of  percutaneous interventions which included a stent placement to his distal  right coronary artery as well as his distal circumflex artery and a stent in  the SVG to first diagonal as well.  Approximately three days prior to  admission, he began experiencing substernal chest discomfort.  He was  admitted for further evaluation and cardiac catheterization.   The patient ruled out for a MI and was taken to the catheterization  laboratory on February 12, 2003.  In the cardiac catheterization laboratory,  his EF was estimated at 50% with possible mild inferior hypokinesis.  The  left main was without critical disease and the LAD was totally occluded.  The LIMA to LAD was widely patent without significant compromise.  The SVG  to first diagonal had no restenosis at the stent site; however, there was a  70% area of stenosis proximal to the previous stent and this was progressive  from the previous study.  The native circumflex was free of critical disease  but the first marginal subbranch had a 70% stenosis and there was 40-50% end-  stent restenosis at the site of previous stent  followed by PTCA on February 05, 2003.  The RCA had no significant end-stent restenosis and there was a 40%  stenosis just distal to the previous stent site.   The patient had a PTCA and stent placement to the SVG to first diagonal,  reducing that stenosis from 75% to 0 with TIMI III flow.  He was not given  heparin secondary to past history of possible heparin-induced  thrombocytopenia.   First catheterization, his groin was stable and there was no problem with  his catheterization site.  The next day, he was ambulating without chest  pain or shortness of breath and considered stable for discharge on February 13, 2003.   LABORATORY DATA:  Hemoglobin 14.6, hematocrit 41.7, WBC 8.7, platelets  165,000.  Sodium 138, potassium 4.5, chloride 105, CO2 28, BUN 21,  creatinine 1.3, glucose 103.   CONDITION ON DISCHARGE:  Stable.   DISCHARGE DIAGNOSES:  1. Chest pain, unstable anginal pain, status post percutaneous transluminal     coronary angioplasty and stent to the saphenous vein graft to the first     diagonal this admission.  2. History of non-ST segment elevation myocardial infarction in early March     of 2004 with percutaneous transluminal coronary angioplasty to the obtuse     marginal for end-stent restenosis.  3. Mildly decreased left ventricular function with an ejection fraction of     50% with inferior hypokinesis at catheterization in March of 2004.  4. History of heparin-induced thrombocytopenia.  5. Status post knee replacement.  6. Hypertension.  7. Hyperlipidemia.  8. Status post aortic coronary bypass surgery in 1992 with left internal     mammary artery to left anterior descending artery, saphenous vein graft     to diagonal.  9. Status post multiple percutaneous interventions including percutaneous     transluminal coronary angioplasty and stent to the right coronary artery,     percutaneous transluminal coronary angioplasty and stent to the saphenous     vein graft  to first diagonal, and percutaneous transluminal coronary     angioplasty and stent to the distal circumflex.   DISCHARGE INSTRUCTIONS:  1. His activity level is to be as tolerated.  2. He is to clean this site with soap and water.  3. He is to see Dr. Aundra Dubin. Revankar in approximately one week.  4. He is to continue his home medications.   DISCHARGE MEDICATIONS:  1. Nitroglycerin sublingual p.r.n.  2. Plavix 75 mg q.d.  3. Imdur 60 mg q.d.  4. Toprol XL 50 mg q.d.  5. Tri-Chlor 160 mg q.d.  6. Aleve as taken prior to admission.     [name], P.A. [facility]                  Thomas D. Riley Kill, M.D. [facility]    RG/MEDQ  D:  02/13/2003  T:  02/13/2003  Job:  [id number]   cc:   Arturo Morton. Riley Kill, M.D. [facility]   Malkiat Dhatt, M.D.  [address]  [place]  [place] [address]  Fax: [phone]   Aundra Dubin. Revankar, M.D.  [address]  [place]  [place] [address]  Fax: [phone]

## 2011-04-21 NOTE — Cardiovascular Report (Signed)
[name], [name]                           ACCOUNT NO.:  [id number]   MEDICAL RECORD NO.:  [id number]                   PATIENT TYPE:  OIB   LOCATION:  6522                                 FACILITY:  [facility]   PHYSICIAN:  [name], M.D. [facility]         DATE OF BIRTH:  December 27, 1934   DATE OF PROCEDURE:  02/12/2003  DATE OF DISCHARGE:                              CARDIAC CATHETERIZATION   INDICATIONS:  The patient is a very pleasant gentleman who previously  underwent revascularization surgery at [facility].  He presented back  in January, and at that time had a stent placed to the circumflex marginal,  a stent placed to the vein graft to the diagonal, and a stent placed to the  proximal right coronary.  On fire off catheterization the stent to the right  coronary was widely patent with about a 40% area of narrowing distally.  The  stent to the circumflex marginal, which was small was severely diseased.  There was in-stent restenosis.  This was re-dilated last week. The stent to  the vein graft revealed a widely patent stent but progressive disease  proximal to that location.  As a result, we went ahead and dilated a week  ago the severe in-stent restenosis.  This in-stent restenosis was really in  a small vessel.  It was previously stented with a 2.25 x 20 Express.  This  was re-dilated last week with a moderate result.  He was brought today for  elective dilatation of the saphenous vein graft to the diagonal.   PROCEDURES:  1. Selective coronary arteriography.  2. Percutaneous stenting of a saphenous vein graft to the diagonal.   DESCRIPTION OF PROCEDURE:  The patient was brought to the catheterization  lab and prepped and draped in the usual fashion.  Through an anterior  puncture, the left femoral artery was easily entered.  During the course of  the procedure, the patient did have some left leg discomfort, but his pulses  were excellent throughout by Doppler in the  laboratory.  Angiomax was given  according to protocol.  ACT was checked and 259.  An additional half dose  bolus was given with a postprocedure ACT of 305.  A JR4 guiding catheter  with side holes was utilized to intubate the vein graft after diagnostic  shots of the left coronary were obtained. The vein graft stenosis was then  crossed with a luge wire and directly stented using a 2.5 x 20 Express II  stent.  I had explained to him in detail the reasons for not using a drug-  eluting stent in a vein graft at the present time.  The sent was taken up to  about 12 atmospheres.  We did not post-dilate the stent given the vein graft  location.  The wire was subsequently removed and final scout films obtained.  Following this, the right coronary artery was then injected to re-evaluate  this area.  All catheters were subsequently removed and the femoral sheath  sewn into place.  Of note, the patient did receive verapamil before and  after stenting of the vein graft to the diagonal.  Furthermore, the internal  mammary was studied a week earlier and was noted to be patent.   The femoral sheath was sewn into place, and the patient was taken to the  holding area in satisfactory clinical condition.   ANGIOGRAPHIC DATA:  1. The saphenous vein graft to the diagonal demonstrates a 70% segmental     stenosis prior to the previously placed stent.  Following stenting, this     was reduced to -10% with an excellent angiographic appearance and TIMI-3     runoff into the distal vessel.  2. The left main is free of critical disease.  3. The LAD is totally occluded as on the previous study.  4. The circumflex provides a tiny marginal followed by a second marginal,     but has about 75% proximal narrowing.  It is a really small caliber     vessel.  The AV circumflex just beyond this has about 50% narrowing and     then a previously placed stent that has some mild diffuse haziness     associated with  dilatation of in-stent restenosis a week earlier.     However, it is patent with good TIMI-3 runoff.  5. The right coronary artery demonstrates a widely patent stent with about     40% narrowing distal to the stent site in the mid vessel.  The distal     vessel is somewhat small in caliber with mildly irregular branch vessels.   CONCLUSIONS:  1. Successful percutaneous stenting of the saphenous vein graft to the     diagonal.  2. Continued patency of the previously dilated stent.   DISPOSITION:  The patient is somewhat frustrated by the overall situation.  I have explained to him that the circumflex system is relatively small in  caliber as is the diagonal, and the right generally is opened as is the  internal mammary to the LAD.  Based upon this we would be very reluctant to  recommend re-do surgery and at the present time would recommend the  palliative approach to trying to maintain continued patency of his conduits.  He will remain on aspirin and Plavix and followup will be with Dr. Tomie Cuevas.                                                  [name], M.D. [facility]    TDS/MEDQ  D:  02/12/2003  T:  02/13/2003  Job:  [id number]   cc:   [name], M.D.  [address]  [place]  [place] [address]  Fax: [phone]   CV Laboratory

## 2011-04-21 NOTE — Cardiovascular Report (Signed)
NAMEGAGE, WEANT                           ACCOUNT NO.:  [id number]   MEDICAL RECORD NO.:  [id number]                   PATIENT TYPE:   LOCATION:                                       FACILITY:   PHYSICIAN:  Charlies Constable, M.D. [facility]              DATE OF BIRTH:  1935-06-08   DATE OF PROCEDURE:  12/10/2002  DATE OF DISCHARGE:                              CARDIAC CATHETERIZATION   CLINICAL HISTORY:  The patient is 76 years old and was admitted three days  ago on 1/4 with chest pain and EKG changes of  non ST elevation infarction.  He had previous bypass surgery approximately  12 years ago.  He had what we  felt were three culprit lesions with a 95% stenosis on the right and  circumflex which were stented on the previous intervention.  We deferred the  final intervention on his saphenous vein graft because he was very  uncomfortable and restless due to back pain.  He had been maintained on an  Angiomax drip.   PROCEDURE:  The procedure was performed by the right femoral artery using an  arterial C.  We first took injection of the circumflex artery to evaluate  the previous stent placement using a diagnostic JL4 catheter.  We then  approached the vein graft to the diagonal branch of the LAD.  We used a 7-  [place] AL1 guiding catheter with side holes and we initially crossed the  lesion with a short __________ wire.  We performed total of three runs with  a export catheter and were able to remove a large amount of thrombus and re-  establish flow down the graft and into the diagonal branch.  We then direct  stented with a 2.5 x 20 mm Express 2 stent deploying this with one inflation  of nine atmospheres for 40 seconds.  The lesion was too distal in the vein  graft to allow [place] to use distal protection with the PercuSurge system.  The  patient was given Verapamil and nitroglycerin before the stent deployment.  Repeat diagnostic images from the guiding catheter.  The patient tolerated  procedure well and left the lab in satisfactory condition.   RESULTS:  The stent in the circumflex marginal vessel was widely patent.  There was about  50%  narrowing proximal to the stent just after a second  sub branch.   Initially, the vein graft of the diagonal branch was completely occluded at  its portion.  Following stenting of the distal portion of the graft, the  stenosis improved from 100% to less than 10% and the flow improved from TIMI-  0 to TIMI-3  flow. The diagonal branch appeared to be free of major  obstruction and there was a good blush with the angiographic injection.   CONCLUSION:  Successful stenting of the  totally occluded saphenous vein  graft to the diagonal branch to the LAD  with an Express stent with  improvement in stent narrowing from 100% to less than 10%, improving the  flow from TIMI-0 to TIMI-3 flow.   DISPOSITION:  The patient was admitted for further observation.                                                Charlies Constable, M.D. [facility]    BB/MEDQ  D:  12/10/2002  T:  12/10/2002  Job:  [id number]

## 2011-07-05 ENCOUNTER — Encounter: Payer: Self-pay | Admitting: Cardiovascular Disease

## 2011-08-01 ENCOUNTER — Encounter: Payer: Self-pay | Admitting: Cardiovascular Disease

## 2011-08-21 ENCOUNTER — Encounter: Payer: Self-pay | Admitting: Cardiovascular Disease

## 2012-05-09 ENCOUNTER — Other Ambulatory Visit: Payer: Self-pay | Admitting: Orthopedic Surgery

## 2012-05-09 DIAGNOSIS — R531 Weakness: Secondary | ICD-10-CM

## 2012-05-09 DIAGNOSIS — M545 Low back pain: Secondary | ICD-10-CM

## 2012-05-13 ENCOUNTER — Ambulatory Visit
Admission: RE | Admit: 2012-05-13 | Discharge: 2012-05-13 | Disposition: A | Discharge status: Home / Self Care | Payer: Medicare Other | Source: Ambulatory Visit | Attending: Orthopedic Surgery | Admitting: Orthopedic Surgery

## 2012-05-13 DIAGNOSIS — R531 Weakness: Secondary | ICD-10-CM

## 2012-05-13 DIAGNOSIS — M545 Low back pain: Secondary | ICD-10-CM

## 2012-07-24 ENCOUNTER — Other Ambulatory Visit: Payer: Self-pay | Admitting: Neurosurgery

## 2012-07-24 DIAGNOSIS — M25552 Pain in left hip: Secondary | ICD-10-CM

## 2012-07-29 ENCOUNTER — Ambulatory Visit
Admission: RE | Admit: 2012-07-29 | Discharge: 2012-07-29 | Disposition: A | Discharge status: Home / Self Care | Payer: Medicare Other | Source: Ambulatory Visit | Attending: Neurosurgery | Admitting: Neurosurgery

## 2012-07-29 DIAGNOSIS — M25552 Pain in left hip: Secondary | ICD-10-CM

## 2013-01-31 ENCOUNTER — Other Ambulatory Visit: Payer: Self-pay | Admitting: Neurosurgery

## 2013-01-31 DIAGNOSIS — M48061 Spinal stenosis, lumbar region without neurogenic claudication: Secondary | ICD-10-CM

## 2013-02-06 ENCOUNTER — Ambulatory Visit
Admission: RE | Admit: 2013-02-06 | Discharge: 2013-02-06 | Disposition: A | Discharge status: Home / Self Care | Payer: Medicare Other | Source: Ambulatory Visit | Attending: Neurosurgery | Admitting: Neurosurgery

## 2013-02-06 DIAGNOSIS — M48061 Spinal stenosis, lumbar region without neurogenic claudication: Secondary | ICD-10-CM

## 2013-03-12 ENCOUNTER — Other Ambulatory Visit: Payer: Self-pay | Admitting: Neurosurgery

## 2013-04-08 ENCOUNTER — Other Ambulatory Visit (HOSPITAL_COMMUNITY): Payer: Medicare Other

## 2013-04-29 ENCOUNTER — Encounter (HOSPITAL_COMMUNITY): Payer: Self-pay | Admitting: Pharmacy Technician

## 2013-04-29 NOTE — Pre-Procedure Instructions (Signed)
Ramel Ohm  04/29/2013   Your procedure is scheduled on:  05-07-2013  Wednesday  Report to [facility] at 6:30 AM.   Call this number if you have problems the morning of surgery: [phone]   Remember:   Do not eat food or drink liquids after midnight.    Take these medicines the morning of surgery with A SIP OF WATER: amlodipine(Norvasc),Tramadol if needed               NO DIABETES MEDICATIONS THE MORNING OF SURGERY   Do not wear jewelry  Do not wear lotions, powders, or perfumes.   Do not shave 48 hours prior to surgery. Men may shave face and neck.  Do not bring valuables to the hospital.  Contacts, dentures or bridgework may not be worn into surgery.  Leave suitcase in the car. After surgery it may be brought to your room.   For patients admitted to the hospital, checkout time is 11:00 AM the day of discharge.   Patients discharged the day of surgery will not be allowed to drive home.    Special Instructions: Shower using CHG 2 nights before surgery and the night before surgery.  If you shower the day of surgery use CHG.  Use special wash - you have one bottle of CHG for all showers.  You should use approximately 1/3 of the bottle for each shower.   Please read over the following fact sheets that you were given: Pain Booklet, Coughing and Deep Breathing, Blood Transfusion Information and Surgical Site Infection Prevention

## 2013-04-30 ENCOUNTER — Encounter (HOSPITAL_COMMUNITY): Payer: Self-pay

## 2013-04-30 ENCOUNTER — Encounter (HOSPITAL_COMMUNITY)
Admission: RE | Admit: 2013-04-30 | Discharge: 2013-04-30 | Disposition: A | Discharge status: Home / Self Care | Payer: Medicare Other | Source: Ambulatory Visit | Attending: Neurosurgery | Admitting: Neurosurgery

## 2013-04-30 ENCOUNTER — Ambulatory Visit (HOSPITAL_COMMUNITY)
Admission: RE | Admit: 2013-04-30 | Discharge: 2013-04-30 | Disposition: A | Discharge status: Home / Self Care | Payer: Medicare Other | Source: Ambulatory Visit | Attending: Anesthesiology | Admitting: Anesthesiology

## 2013-04-30 DIAGNOSIS — I252 Old myocardial infarction: Secondary | ICD-10-CM | POA: Insufficient documentation

## 2013-04-30 DIAGNOSIS — I498 Other specified cardiac arrhythmias: Secondary | ICD-10-CM | POA: Insufficient documentation

## 2013-04-30 DIAGNOSIS — Z01818 Encounter for other preprocedural examination: Secondary | ICD-10-CM | POA: Insufficient documentation

## 2013-04-30 DIAGNOSIS — R9431 Abnormal electrocardiogram [ECG] [EKG]: Secondary | ICD-10-CM | POA: Insufficient documentation

## 2013-04-30 DIAGNOSIS — I4510 Unspecified right bundle-branch block: Secondary | ICD-10-CM | POA: Insufficient documentation

## 2013-04-30 DIAGNOSIS — Z01810 Encounter for preprocedural cardiovascular examination: Secondary | ICD-10-CM | POA: Insufficient documentation

## 2013-04-30 DIAGNOSIS — I10 Essential (primary) hypertension: Secondary | ICD-10-CM | POA: Insufficient documentation

## 2013-04-30 DIAGNOSIS — Z01812 Encounter for preprocedural laboratory examination: Secondary | ICD-10-CM | POA: Insufficient documentation

## 2013-04-30 HISTORY — DX: Type 2 diabetes mellitus without complications: E11.9

## 2013-04-30 HISTORY — DX: Acute myocardial infarction, unspecified: I21.9

## 2013-04-30 HISTORY — DX: Anemia, unspecified: D64.9

## 2013-04-30 HISTORY — DX: Unspecified osteoarthritis, unspecified site: M19.90

## 2013-04-30 HISTORY — DX: Essential (primary) hypertension: I10

## 2013-04-30 LAB — CBC
MCH: 25.9 pg — ABNORMAL LOW (ref 26.0–34.0)
MCV: 77.7 fL — ABNORMAL LOW (ref 78.0–100.0)
Platelets: 88 10*3/uL — ABNORMAL LOW (ref 150–400)
RBC: 5.87 MIL/uL — ABNORMAL HIGH (ref 4.22–5.81)
RDW: 17.4 % — ABNORMAL HIGH (ref 11.5–15.5)

## 2013-04-30 LAB — BASIC METABOLIC PANEL
CO2: 27 mEq/L (ref 19–32)
Calcium: 9.5 mg/dL (ref 8.4–10.5)
Creatinine, Ser: 1.12 mg/dL (ref 0.50–1.35)
Glucose, Bld: 77 mg/dL (ref 70–99)

## 2013-05-01 NOTE — Progress Notes (Signed)
Spoke with Erie Noe @ Dr. Lonie Peak office,per Dr. Phoebe Perch pt. Will need cardiac clearance prior to surgery.

## 2013-05-06 MED ORDER — CEFAZOLIN SODIUM-DEXTROSE 2-3 GM-% IV SOLR
2.0000 g | INTRAVENOUS | Status: AC
  Administered 2013-05-07 (×2): 2 g via INTRAVENOUS
  Filled 2013-05-06: qty 50

## 2013-05-06 MED ORDER — DEXAMETHASONE SODIUM PHOSPHATE 10 MG/ML IJ SOLN
10.0000 mg | INTRAMUSCULAR | Status: AC
  Administered 2013-05-07: 10 mg via INTRAVENOUS
  Filled 2013-05-06: qty 1

## 2013-05-06 NOTE — Progress Notes (Signed)
Anesthesia Chart Review:  Patient is a 78 year old male scheduled for L2-4 PLIF, redo L5-S1 fusion on 05/07/13 by Dr. Wynetta Emery.  History includes CAD/MI s/p CABG 1992 [facility]) followed by stent to CX, SVG to DIAG, and RCA '04, DM2, HTN, HLD, anemia, former smoker.  He does not consistently see a cardiologist, with last visit actually being on 12/31/09 prior to his L4-5, L5-S1 PLIF in 02/2010.  He was seen by Dr. Raynelle Bring with [facility] at their previous [place] location for a preoperative clearance then and a stress test done on 01/05/10 and showed EF 51%, reversibility of mild degree in the anterior segment of the LV which may be secondary to soft tissue attenuation.  Dr. Roseanne Reno felt the study was overall low risk.  His PCP is Dr. Gabriel Cirri from [facility] in [place], who medically cleared patient for this procedure.  EKG from 04/30/13 appears stable, showing SB with sinus arrhythmia, LAD, right BBB.  His last cardiac cath was in 2004 (see Notes tab).  CXR on 04/30/13 showed: 1. Hyperexpanded lungs without acute cardiopulmonary disease.  2. Chronic blunting of the left costophrenic angle favored to be secondary to atelectasis or scar.   Preoperative labs noted.  PLT count is 88K.  Meds include ASA, garlic, and fish oil.  His PLT count on 02/09/10 was 176K.  Patient's cardiac history had already been reviewed by anesthesiologist Dr. Chaney Malling with a request for cardiac clearance.  Dr. Dola Argyle' office was able to obtain clearance from patient's PCP.  This note and previous cardiac records/tests reviewed with anesthesiologist Dr. Jacklynn Bue along with PLT count.  If patient is not having new CV/CHF symptoms he felt patient could likely proceed since he was medically cleared.  Dr. Jacklynn Bue spoke with Dr. Wynetta Emery about cardiac history and thrombocytopenia.  Dr. Wynetta Emery will address the thrombocytopenia.  I will order a repeat CBC since patient should have already held his garlic, fish  oil, and ASA by now--hopefully his PLT count will have increased.  Velna Ochs [facility] Stay [facility]/Anesthesiology Phone [phone] 05/06/2013 12:21 PM

## 2013-05-07 ENCOUNTER — Inpatient Hospital Stay (HOSPITAL_COMMUNITY): Payer: Medicare Other

## 2013-05-07 ENCOUNTER — Encounter (HOSPITAL_COMMUNITY)
Admission: RE | Disposition: A | Discharge status: Skilled Nursing Facility | Payer: Self-pay | Source: Ambulatory Visit | Attending: Neurosurgery

## 2013-05-07 ENCOUNTER — Inpatient Hospital Stay (HOSPITAL_COMMUNITY)
Admission: RE | Admit: 2013-05-07 | Discharge: 2013-05-14 | DRG: 460 | Disposition: A | Discharge status: Skilled Nursing Facility | Payer: Medicare Other | Source: Ambulatory Visit | Attending: Neurosurgery | Admitting: Neurosurgery

## 2013-05-07 ENCOUNTER — Encounter (HOSPITAL_COMMUNITY): Payer: Self-pay | Admitting: Anesthesiology

## 2013-05-07 ENCOUNTER — Inpatient Hospital Stay (HOSPITAL_COMMUNITY): Payer: Medicare Other | Admitting: Anesthesiology

## 2013-05-07 DIAGNOSIS — F99 Mental disorder, not otherwise specified: Secondary | ICD-10-CM | POA: Diagnosis not present

## 2013-05-07 DIAGNOSIS — T84498A Other mechanical complication of other internal orthopedic devices, implants and grafts, initial encounter: Principal | ICD-10-CM | POA: Diagnosis present

## 2013-05-07 DIAGNOSIS — E119 Type 2 diabetes mellitus without complications: Secondary | ICD-10-CM | POA: Diagnosis present

## 2013-05-07 DIAGNOSIS — D649 Anemia, unspecified: Secondary | ICD-10-CM | POA: Diagnosis present

## 2013-05-07 DIAGNOSIS — Z79899 Other long term (current) drug therapy: Secondary | ICD-10-CM

## 2013-05-07 DIAGNOSIS — M51379 Other intervertebral disc degeneration, lumbosacral region without mention of lumbar back pain or lower extremity pain: Secondary | ICD-10-CM | POA: Diagnosis present

## 2013-05-07 DIAGNOSIS — M129 Arthropathy, unspecified: Secondary | ICD-10-CM | POA: Diagnosis present

## 2013-05-07 DIAGNOSIS — E875 Hyperkalemia: Secondary | ICD-10-CM | POA: Diagnosis present

## 2013-05-07 DIAGNOSIS — Z87891 Personal history of nicotine dependence: Secondary | ICD-10-CM

## 2013-05-07 DIAGNOSIS — D696 Thrombocytopenia, unspecified: Secondary | ICD-10-CM | POA: Diagnosis present

## 2013-05-07 DIAGNOSIS — M48061 Spinal stenosis, lumbar region without neurogenic claudication: Secondary | ICD-10-CM | POA: Diagnosis present

## 2013-05-07 DIAGNOSIS — G8918 Other acute postprocedural pain: Secondary | ICD-10-CM | POA: Diagnosis not present

## 2013-05-07 DIAGNOSIS — I10 Essential (primary) hypertension: Secondary | ICD-10-CM | POA: Diagnosis present

## 2013-05-07 DIAGNOSIS — Z7982 Long term (current) use of aspirin: Secondary | ICD-10-CM

## 2013-05-07 DIAGNOSIS — I2510 Atherosclerotic heart disease of native coronary artery without angina pectoris: Secondary | ICD-10-CM | POA: Diagnosis present

## 2013-05-07 DIAGNOSIS — M5137 Other intervertebral disc degeneration, lumbosacral region: Secondary | ICD-10-CM | POA: Diagnosis present

## 2013-05-07 DIAGNOSIS — R339 Retention of urine, unspecified: Secondary | ICD-10-CM | POA: Diagnosis not present

## 2013-05-07 DIAGNOSIS — Y831 Surgical operation with implant of artificial internal device as the cause of abnormal reaction of the patient, or of later complication, without mention of misadventure at the time of the procedure: Secondary | ICD-10-CM | POA: Diagnosis present

## 2013-05-07 DIAGNOSIS — E785 Hyperlipidemia, unspecified: Secondary | ICD-10-CM | POA: Diagnosis present

## 2013-05-07 DIAGNOSIS — Z951 Presence of aortocoronary bypass graft: Secondary | ICD-10-CM

## 2013-05-07 DIAGNOSIS — I252 Old myocardial infarction: Secondary | ICD-10-CM

## 2013-05-07 LAB — CBC WITH DIFFERENTIAL/PLATELET
Basophils Absolute: 0.1 10*3/uL (ref 0.0–0.1)
Basophils Relative: 0 % (ref 0–1)
Eosinophils Absolute: 0 10*3/uL (ref 0.0–0.7)
Eosinophils Relative: 0 % (ref 0–5)
Lymphocytes Relative: 5 % — ABNORMAL LOW (ref 12–46)
MCH: 25.7 pg — ABNORMAL LOW (ref 26.0–34.0)
MCHC: 33.2 g/dL (ref 30.0–36.0)
MCV: 77.6 fL — ABNORMAL LOW (ref 78.0–100.0)
Platelets: 106 10*3/uL — ABNORMAL LOW (ref 150–400)
RDW: 17.6 % — ABNORMAL HIGH (ref 11.5–15.5)
WBC: 19.7 10*3/uL — ABNORMAL HIGH (ref 4.0–10.5)

## 2013-05-07 LAB — CBC
HCT: 44.8 % (ref 39.0–52.0)
MCH: 26.1 pg (ref 26.0–34.0)
MCV: 77.5 fL — ABNORMAL LOW (ref 78.0–100.0)
MCV: 77.4 fL — ABNORMAL LOW (ref 78.0–100.0)
Platelets: 88 10*3/uL — ABNORMAL LOW (ref 150–400)
Platelets: 96 10*3/uL — ABNORMAL LOW (ref 150–400)
RBC: 5.78 MIL/uL (ref 4.22–5.81)
RBC: 5.09 MIL/uL (ref 4.22–5.81)
WBC: 17.3 10*3/uL — ABNORMAL HIGH (ref 4.0–10.5)

## 2013-05-07 LAB — BASIC METABOLIC PANEL
Calcium: 8.4 mg/dL (ref 8.4–10.5)
GFR calc Af Amer: 65 mL/min — ABNORMAL LOW (ref >90)
GFR calc non Af Amer: 56 mL/min — ABNORMAL LOW (ref >90)
Sodium: 137 mEq/L (ref 135–145)

## 2013-05-07 LAB — GLUCOSE, CAPILLARY
Glucose-Capillary: 88 mg/dL (ref 70–99)
Glucose-Capillary: 161 mg/dL — ABNORMAL HIGH (ref 70–99)

## 2013-05-07 LAB — PROTIME-INR
INR: 1.16 (ref 0.00–1.49)
Prothrombin Time: 14.6 seconds (ref 11.6–15.2)

## 2013-05-07 SURGERY — POSTERIOR LUMBAR FUSION 2 LEVEL
Anesthesia: General | Site: Spine Lumbar | Wound class: Clean

## 2013-05-07 MED ORDER — AMLODIPINE BESYLATE 10 MG PO TABS
10.0000 mg | ORAL_TABLET | Freq: Every day | ORAL | Status: DC
Start: 2013-05-08 — End: 2013-05-14
  Administered 2013-05-08 – 2013-05-14 (×7): 10 mg via ORAL
  Filled 2013-05-07 (×7): qty 1

## 2013-05-07 MED ORDER — HYDROMORPHONE HCL PF 1 MG/ML IJ SOLN
INTRAMUSCULAR | Status: AC
  Filled 2013-05-07: qty 1

## 2013-05-07 MED ORDER — ASPIRIN 325 MG PO TABS
325.0000 mg | ORAL_TABLET | Freq: Every day | ORAL | Status: DC
  Filled 2013-05-07: qty 1

## 2013-05-07 MED ORDER — HYDROMORPHONE 0.3 MG/ML IV SOLN: INTRAVENOUS | Status: DC

## 2013-05-07 MED ORDER — SODIUM CHLORIDE 0.9 % IJ SOLN: 9.0000 mL | INTRAMUSCULAR | Status: DC | PRN

## 2013-05-07 MED ORDER — ACETAMINOPHEN 325 MG PO TABS: 650.0000 mg | ORAL_TABLET | ORAL | Status: DC | PRN

## 2013-05-07 MED ORDER — PANTOPRAZOLE SODIUM 40 MG IV SOLR
40.0000 mg | Freq: Every day | INTRAVENOUS | Status: DC
  Administered 2013-05-07: 40 mg via INTRAVENOUS
  Filled 2013-05-07 (×2): qty 40

## 2013-05-07 MED ORDER — OXYCODONE HCL 5 MG/5ML PO SOLN: 5.0000 mg | Freq: Once | ORAL | Status: DC | PRN

## 2013-05-07 MED ORDER — DIPHENHYDRAMINE HCL 12.5 MG/5ML PO ELIX
12.5000 mg | ORAL_SOLUTION | Freq: Four times a day (QID) | ORAL | Status: DC | PRN
  Filled 2013-05-07: qty 5

## 2013-05-07 MED ORDER — INSULIN ASPART 100 UNIT/ML ~~LOC~~ SOLN
0.0000 [IU] | Freq: Three times a day (TID) | SUBCUTANEOUS | Status: DC
  Administered 2013-05-09 – 2013-05-11 (×4): 2 [IU] via SUBCUTANEOUS

## 2013-05-07 MED ORDER — NITROGLYCERIN 0.4 MG SL SUBL: 0.4000 mg | SUBLINGUAL_TABLET | SUBLINGUAL | Status: DC | PRN

## 2013-05-07 MED ORDER — CEFAZOLIN SODIUM 1-5 GM-% IV SOLN
1.0000 g | Freq: Three times a day (TID) | INTRAVENOUS | Status: AC
  Administered 2013-05-07 – 2013-05-11 (×12): 1 g via INTRAVENOUS
  Filled 2013-05-07 (×13): qty 50

## 2013-05-07 MED ORDER — OXYCODONE-ACETAMINOPHEN 5-325 MG PO TABS
1.0000 | ORAL_TABLET | ORAL | Status: DC | PRN
  Administered 2013-05-07 – 2013-05-08 (×4): 2 via ORAL
  Administered 2013-05-08: 1 via ORAL
  Administered 2013-05-09 – 2013-05-11 (×8): 2 via ORAL
  Administered 2013-05-13 (×3): 1 via ORAL
  Administered 2013-05-14 (×3): 2 via ORAL
  Filled 2013-05-07 (×7): qty 2
  Filled 2013-05-07: qty 1
  Filled 2013-05-07: qty 2
  Filled 2013-05-07: qty 1
  Filled 2013-05-07 (×6): qty 2
  Filled 2013-05-07: qty 1
  Filled 2013-05-07 (×2): qty 2

## 2013-05-07 MED ORDER — PROMETHAZINE HCL 25 MG/ML IJ SOLN: 6.2500 mg | INTRAMUSCULAR | Status: DC | PRN

## 2013-05-07 MED ORDER — ROCURONIUM BROMIDE 100 MG/10ML IV SOLN
INTRAVENOUS | Status: DC | PRN
  Administered 2013-05-07 (×4): 20 mg via INTRAVENOUS
  Administered 2013-05-07: 10 mg via INTRAVENOUS
  Administered 2013-05-07: 50 mg via INTRAVENOUS
  Administered 2013-05-07 (×2): 10 mg via INTRAVENOUS
  Administered 2013-05-07: 20 mg via INTRAVENOUS
  Administered 2013-05-07: 10 mg via INTRAVENOUS

## 2013-05-07 MED ORDER — HYDROMORPHONE HCL PF 1 MG/ML IJ SOLN
0.2500 mg | INTRAMUSCULAR | Status: DC | PRN
  Administered 2013-05-07 (×2): 0.5 mg via INTRAVENOUS

## 2013-05-07 MED ORDER — MENTHOL 3 MG MT LOZG: 1.0000 | LOZENGE | OROMUCOSAL | Status: DC | PRN

## 2013-05-07 MED ORDER — OXYCODONE HCL 5 MG PO TABS: 5.0000 mg | ORAL_TABLET | Freq: Once | ORAL | Status: DC | PRN

## 2013-05-07 MED ORDER — TRAMADOL HCL 50 MG PO TABS
50.0000 mg | ORAL_TABLET | Freq: Four times a day (QID) | ORAL | Status: DC | PRN
  Administered 2013-05-07 – 2013-05-09 (×6): 100 mg via ORAL
  Filled 2013-05-07 (×7): qty 2

## 2013-05-07 MED ORDER — ACETAMINOPHEN 10 MG/ML IV SOLN
1000.0000 mg | Freq: Four times a day (QID) | INTRAVENOUS | Status: AC
  Administered 2013-05-07 – 2013-05-08 (×2): 1000 mg via INTRAVENOUS
  Filled 2013-05-07 (×4): qty 100

## 2013-05-07 MED ORDER — CEFAZOLIN SODIUM-DEXTROSE 2-3 GM-% IV SOLR
INTRAVENOUS | Status: AC
  Filled 2013-05-07: qty 50

## 2013-05-07 MED ORDER — INSULIN ASPART 100 UNIT/ML ~~LOC~~ SOLN: 0.0000 [IU] | Freq: Every day | SUBCUTANEOUS | Status: DC

## 2013-05-07 MED ORDER — 0.9 % SODIUM CHLORIDE (POUR BTL) OPTIME
TOPICAL | Status: DC | PRN
  Administered 2013-05-07: 1000 mL

## 2013-05-07 MED ORDER — NALOXONE HCL 0.4 MG/ML IJ SOLN: 0.4000 mg | INTRAMUSCULAR | Status: DC | PRN

## 2013-05-07 MED ORDER — SODIUM CHLORIDE 0.9 % IJ SOLN: 3.0000 mL | INTRAMUSCULAR | Status: DC | PRN

## 2013-05-07 MED ORDER — ONDANSETRON HCL 4 MG/2ML IJ SOLN: 4.0000 mg | INTRAMUSCULAR | Status: DC | PRN

## 2013-05-07 MED ORDER — NEOSTIGMINE METHYLSULFATE 1 MG/ML IJ SOLN
INTRAMUSCULAR | Status: DC | PRN
  Administered 2013-05-07: 5 mg via INTRAVENOUS

## 2013-05-07 MED ORDER — THROMBIN 20000 UNITS EX SOLR
CUTANEOUS | Status: DC | PRN
  Administered 2013-05-07 (×2): via TOPICAL

## 2013-05-07 MED ORDER — SUFENTANIL CITRATE 50 MCG/ML IV SOLN
INTRAVENOUS | Status: DC | PRN
  Administered 2013-05-07: 15 ug via INTRAVENOUS
  Administered 2013-05-07 (×3): 5 ug via INTRAVENOUS
  Administered 2013-05-07: 10 ug via INTRAVENOUS
  Administered 2013-05-07: 5 ug via INTRAVENOUS
  Administered 2013-05-07: 10 ug via INTRAVENOUS
  Administered 2013-05-07: 5 ug via INTRAVENOUS
  Administered 2013-05-07 (×2): 10 ug via INTRAVENOUS

## 2013-05-07 MED ORDER — HYDROMORPHONE HCL PF 1 MG/ML IJ SOLN
0.5000 mg | INTRAMUSCULAR | Status: DC | PRN
  Administered 2013-05-07 – 2013-05-10 (×14): 1 mg via INTRAVENOUS
  Filled 2013-05-07 (×15): qty 1

## 2013-05-07 MED ORDER — MEPERIDINE HCL 25 MG/ML IJ SOLN: 6.2500 mg | INTRAMUSCULAR | Status: DC | PRN

## 2013-05-07 MED ORDER — MIDAZOLAM HCL 5 MG/5ML IJ SOLN
INTRAMUSCULAR | Status: DC | PRN
  Administered 2013-05-07: 1 mg via INTRAVENOUS

## 2013-05-07 MED ORDER — PROPOFOL 10 MG/ML IV BOLUS
INTRAVENOUS | Status: DC | PRN
  Administered 2013-05-07: 170 mg via INTRAVENOUS

## 2013-05-07 MED ORDER — BACITRACIN 50000 UNITS IM SOLR
INTRAMUSCULAR | Status: AC
  Filled 2013-05-07: qty 1

## 2013-05-07 MED ORDER — SODIUM CHLORIDE 0.9 % IR SOLN
Status: DC | PRN
  Administered 2013-05-07: 10:00:00

## 2013-05-07 MED ORDER — BIOTENE DRY MOUTH MT LIQD
15.0000 mL | Freq: Two times a day (BID) | OROMUCOSAL | Status: DC
  Administered 2013-05-07 – 2013-05-08 (×2): 15 mL via OROMUCOSAL

## 2013-05-07 MED ORDER — DIPHENHYDRAMINE HCL 50 MG/ML IJ SOLN: 12.5000 mg | Freq: Four times a day (QID) | INTRAMUSCULAR | Status: DC | PRN

## 2013-05-07 MED ORDER — LACTATED RINGERS IV SOLN
INTRAVENOUS | Status: DC | PRN
  Administered 2013-05-07 (×3): via INTRAVENOUS

## 2013-05-07 MED ORDER — ONDANSETRON HCL 4 MG/2ML IJ SOLN: 4.0000 mg | Freq: Four times a day (QID) | INTRAMUSCULAR | Status: DC | PRN

## 2013-05-07 MED ORDER — BUPIVACAINE HCL (PF) 0.25 % IJ SOLN
INTRAMUSCULAR | Status: DC | PRN
  Administered 2013-05-07: 10 mL

## 2013-05-07 MED ORDER — ONDANSETRON HCL 4 MG/2ML IJ SOLN
INTRAMUSCULAR | Status: DC | PRN
  Administered 2013-05-07: 4 mg via INTRAVENOUS

## 2013-05-07 MED ORDER — DIPHENHYDRAMINE HCL 50 MG/ML IJ SOLN
INTRAMUSCULAR | Status: DC | PRN
  Administered 2013-05-07: 12.5 mg via INTRAVENOUS

## 2013-05-07 MED ORDER — DOCUSATE SODIUM 100 MG PO CAPS
100.0000 mg | ORAL_CAPSULE | Freq: Two times a day (BID) | ORAL | Status: DC
  Administered 2013-05-07 – 2013-05-14 (×14): 100 mg via ORAL
  Filled 2013-05-07 (×14): qty 1

## 2013-05-07 MED ORDER — SODIUM CHLORIDE 0.9 % IV SOLN
INTRAVENOUS | Status: AC
  Filled 2013-05-07: qty 500

## 2013-05-07 MED ORDER — CYCLOBENZAPRINE HCL 10 MG PO TABS
10.0000 mg | ORAL_TABLET | Freq: Three times a day (TID) | ORAL | Status: DC | PRN
  Administered 2013-05-07 – 2013-05-12 (×5): 10 mg via ORAL
  Filled 2013-05-07 (×5): qty 1

## 2013-05-07 MED ORDER — SODIUM CHLORIDE 0.9 % IV SOLN: 250.0000 mL | INTRAVENOUS | Status: DC

## 2013-05-07 MED ORDER — LIDOCAINE-EPINEPHRINE 1 %-1:100000 IJ SOLN
INTRAMUSCULAR | Status: DC | PRN
  Administered 2013-05-07: 10 mL

## 2013-05-07 MED ORDER — ALBUMIN HUMAN 5 % IV SOLN
INTRAVENOUS | Status: DC | PRN
  Administered 2013-05-07: 11:00:00 via INTRAVENOUS

## 2013-05-07 MED ORDER — POTASSIUM CHLORIDE IN NACL 20-0.9 MEQ/L-% IV SOLN
INTRAVENOUS | Status: DC
  Administered 2013-05-07 – 2013-05-08 (×2): via INTRAVENOUS
  Filled 2013-05-07 (×4): qty 1000

## 2013-05-07 MED ORDER — SODIUM CHLORIDE 0.9 % IJ SOLN
3.0000 mL | Freq: Two times a day (BID) | INTRAMUSCULAR | Status: DC
  Administered 2013-05-07 – 2013-05-14 (×13): 3 mL via INTRAVENOUS

## 2013-05-07 MED ORDER — PHENOL 1.4 % MT LIQD: 1.0000 | OROMUCOSAL | Status: DC | PRN

## 2013-05-07 MED ORDER — ACETAMINOPHEN 650 MG RE SUPP: 650.0000 mg | RECTAL | Status: DC | PRN

## 2013-05-07 MED ORDER — GLYCOPYRROLATE 0.2 MG/ML IJ SOLN
INTRAMUSCULAR | Status: DC | PRN
  Administered 2013-05-07: .6 mg via INTRAVENOUS

## 2013-05-07 MED ORDER — GLIPIZIDE ER 2.5 MG PO TB24
2.5000 mg | ORAL_TABLET | Freq: Every day | ORAL | Status: DC
Start: 2013-05-08 — End: 2013-05-14
  Administered 2013-05-08 – 2013-05-14 (×7): 2.5 mg via ORAL
  Filled 2013-05-07 (×9): qty 1

## 2013-05-07 MED ORDER — ALUM & MAG HYDROXIDE-SIMETH 200-200-20 MG/5ML PO SUSP: 30.0000 mL | Freq: Four times a day (QID) | ORAL | Status: DC | PRN

## 2013-05-07 SURGICAL SUPPLY — 81 items
ADH SKN CLS LQ APL DERMABOND (GAUZE/BANDAGES/DRESSINGS) ×1
APL SKNCLS STERI-STRIP NONHPOA (GAUZE/BANDAGES/DRESSINGS) ×1
BAG DECANTER FOR FLEXI CONT (MISCELLANEOUS) ×2 IMPLANT
BENZOIN TINCTURE PRP APPL 2/3 (GAUZE/BANDAGES/DRESSINGS) ×2 IMPLANT
BLADE SURG 11 STRL SS (BLADE) ×2 IMPLANT
BLADE SURG ROTATE 9660 (MISCELLANEOUS) ×2 IMPLANT
BRUSH SCRUB EZ PLAIN DRY (MISCELLANEOUS) ×2 IMPLANT
BUR MATCHSTICK NEURO 3.0 LAGG (BURR) ×2 IMPLANT
BUR PRECISION FLUTE 6.0 (BURR) ×2 IMPLANT
CANISTER SUCTION 2500CC (MISCELLANEOUS) ×2 IMPLANT
CAP REVERE LOCKING (Cap) ×12 IMPLANT
CLOTH BEACON ORANGE TIMEOUT ST (SAFETY) ×2 IMPLANT
CONN CROSSLINK REV 6.35 48-60 (Connector) ×2 IMPLANT
CONNECTOR CRSLNK REV6.35 48-60 (Connector) ×1 IMPLANT
CONT SPEC 4OZ CLIKSEAL STRL BL (MISCELLANEOUS) ×4 IMPLANT
COVER BACK TABLE 24X17X13 BIG (DRAPES) IMPLANT
COVER TABLE BACK 60X90 (DRAPES) ×2 IMPLANT
Caliber Cage 10x26mm,9-13mm,12 degree ×4 IMPLANT
DECANTER SPIKE VIAL GLASS SM (MISCELLANEOUS) IMPLANT
DERMABOND ADHESIVE PROPEN (GAUZE/BANDAGES/DRESSINGS) ×1
DERMABOND ADVANCED (GAUZE/BANDAGES/DRESSINGS)
DERMABOND ADVANCED .7 DNX12 (GAUZE/BANDAGES/DRESSINGS) IMPLANT
DERMABOND ADVANCED .7 DNX6 (GAUZE/BANDAGES/DRESSINGS) ×1 IMPLANT
DRAPE C-ARM 42X72 X-RAY (DRAPES) ×4 IMPLANT
DRAPE LAPAROTOMY 100X72X124 (DRAPES) ×2 IMPLANT
DRAPE POUCH INSTRU U-SHP 10X18 (DRAPES) ×2 IMPLANT
DRAPE PROXIMA HALF (DRAPES) IMPLANT
DRAPE SURG 17X23 STRL (DRAPES) ×2 IMPLANT
DRSG OPSITE 4X5.5 SM (GAUZE/BANDAGES/DRESSINGS) ×6 IMPLANT
DURAPREP 26ML APPLICATOR (WOUND CARE) ×2 IMPLANT
ELECT REM PT RETURN 9FT ADLT (ELECTROSURGICAL) ×2
ELECTRODE REM PT RTRN 9FT ADLT (ELECTROSURGICAL) ×1 IMPLANT
EVACUATOR 3/16  PVC DRAIN (DRAIN) ×1
EVACUATOR 3/16 PVC DRAIN (DRAIN) ×1 IMPLANT
GAUZE SPONGE 4X4 16PLY XRAY LF (GAUZE/BANDAGES/DRESSINGS) ×6 IMPLANT
GLOVE BIO SURGEON STRL SZ8 (GLOVE) ×6 IMPLANT
GLOVE BIO SURGEON STRL SZ8.5 (GLOVE) ×2 IMPLANT
GLOVE BIOGEL PI IND STRL 7.0 (GLOVE) ×2 IMPLANT
GLOVE BIOGEL PI IND STRL 8 (GLOVE) ×1 IMPLANT
GLOVE BIOGEL PI INDICATOR 7.0 (GLOVE) ×2
GLOVE BIOGEL PI INDICATOR 8 (GLOVE) ×1
GLOVE ECLIPSE 7.5 STRL STRAW (GLOVE) ×4 IMPLANT
GLOVE EXAM NITRILE LRG STRL (GLOVE) ×2 IMPLANT
GLOVE EXAM NITRILE MD LF STRL (GLOVE) ×2 IMPLANT
GLOVE EXAM NITRILE XL STR (GLOVE) IMPLANT
GLOVE EXAM NITRILE XS STR PU (GLOVE) IMPLANT
GLOVE INDICATOR 8.5 STRL (GLOVE) ×6 IMPLANT
GLOVE SS BIOGEL STRL SZ 6.5 (GLOVE) ×5 IMPLANT
GLOVE SS BIOGEL STRL SZ 8 (GLOVE) ×1 IMPLANT
GLOVE SUPERSENSE BIOGEL SZ 6.5 (GLOVE) ×5
GLOVE SUPERSENSE BIOGEL SZ 8 (GLOVE) ×1
GOWN BRE IMP SLV AUR LG STRL (GOWN DISPOSABLE) ×2 IMPLANT
GOWN BRE IMP SLV AUR XL STRL (GOWN DISPOSABLE) ×8 IMPLANT
GOWN STRL REIN 2XL LVL4 (GOWN DISPOSABLE) IMPLANT
KIT BASIN OR (CUSTOM PROCEDURE TRAY) ×2 IMPLANT
KIT INFUSE SMALL (Orthopedic Implant) ×2 IMPLANT
KIT ROOM TURNOVER OR (KITS) ×2 IMPLANT
MIX DBX 10CC 35% BONE (Bone Implant) ×2 IMPLANT
NEEDLE HYPO 25X1 1.5 SAFETY (NEEDLE) ×2 IMPLANT
NS IRRIG 1000ML POUR BTL (IV SOLUTION) ×2 IMPLANT
PACK LAMINECTOMY NEURO (CUSTOM PROCEDURE TRAY) ×2 IMPLANT
PAD ARMBOARD 7.5X6 YLW CONV (MISCELLANEOUS) ×10 IMPLANT
PATTIES SURGICAL 1X1 (DISPOSABLE) ×2 IMPLANT
ROD REVERE 6.35 CURVED 125MM (Rod) ×4 IMPLANT
SCREW REVERE 6.35 6.5MMX45 (Screw) ×8 IMPLANT
SCREW REVERE 6.35 8.5X40M (Screw) ×2 IMPLANT
SPACER CALIBER 10X22 9-13MM-12 (Spacer) ×4 IMPLANT
SPONGE GAUZE 4X4 12PLY (GAUZE/BANDAGES/DRESSINGS) ×2 IMPLANT
SPONGE LAP 4X18 X RAY DECT (DISPOSABLE) IMPLANT
SPONGE SURGIFOAM ABS GEL 100 (HEMOSTASIS) ×4 IMPLANT
STRIP CLOSURE SKIN 1/2X4 (GAUZE/BANDAGES/DRESSINGS) ×4 IMPLANT
SUT BONE WAX W31G (SUTURE) ×2 IMPLANT
SUT VIC AB 0 CT1 18XCR BRD8 (SUTURE) ×2 IMPLANT
SUT VIC AB 0 CT1 8-18 (SUTURE) ×2
SUT VIC AB 2-0 CT1 18 (SUTURE) ×4 IMPLANT
SUT VICRYL 4-0 PS2 18IN ABS (SUTURE) ×2 IMPLANT
SYR 20ML ECCENTRIC (SYRINGE) ×2 IMPLANT
TOWEL OR 17X24 6PK STRL BLUE (TOWEL DISPOSABLE) ×2 IMPLANT
TOWEL OR 17X26 10 PK STRL BLUE (TOWEL DISPOSABLE) ×2 IMPLANT
TRAY FOLEY CATH 14FRSI W/METER (CATHETERS) ×2 IMPLANT
WATER STERILE IRR 1000ML POUR (IV SOLUTION) ×2 IMPLANT

## 2013-05-07 NOTE — Anesthesia Procedure Notes (Signed)
Procedure Name: Intubation Date/Time: 05/07/2013 8:45 AM Performed by: Coralee Rud Pre-anesthesia Checklist: Patient identified, Emergency Drugs available, Suction available and Patient being monitored Patient Re-evaluated:Patient Re-evaluated prior to inductionOxygen Delivery Method: Circle system utilized Preoxygenation: Pre-oxygenation with 100% oxygen Intubation Type: IV induction Ventilation: Mask ventilation without difficulty Laryngoscope Size: Miller and 3 Grade View: Grade III Tube type: Oral Tube size: 8.0 mm Number of attempts: 1 (Moderate amount of laryngeal pressure) Airway Equipment and Method: Stylet Placement Confirmation: ETT inserted through vocal cords under direct vision and positive ETCO2 Secured at: 23 cm Tube secured with: Tape Dental Injury: Teeth and Oropharynx as per pre-operative assessment  Future Recommendations: Recommend- induction with short-acting agent, and alternative techniques readily available

## 2013-05-07 NOTE — Transfer of Care (Signed)
Immediate Anesthesia Transfer of Care Note  Patient: [name]  Procedure(s) Performed: Procedure(s): POSTERIOR LUMBAR FUSION 2 LEVEL (N/A)  Patient Location: PACU  Anesthesia Type:General  Level of Consciousness: awake, sedated, patient cooperative and lethargic  Airway & Oxygen Therapy: Patient Spontanous Breathing, Patient connected to nasal cannula oxygen and Patient connected to face mask oxygen  Post-op Assessment: Report given to PACU RN, Post -op Vital signs reviewed and stable, Patient moving all extremities and Patient moving all extremities X 4  Post vital signs: Reviewed and stable  Complications: No apparent anesthesia complications

## 2013-05-07 NOTE — H&P (Signed)
[name] is an 78 y.o. male.   Chief Complaint: Back and bilateral leg pain and numbness HPI: Patient is a 78 year old gentleman who undergone a previous L4-S1 fusion many years ago initially did very well however he presents with worsening back pain and bilateral leg pain and neurogenic claudication. Workup has been extensive with CT scans MRI scans as well as ultrasounds of the arteries in his legs ruling out vascular etiology to his claudication. Both the CT scan in his lumbar spine MRI has shown progressive degenerative stenosis at L2-3 L3-4 on top of idiopathic short pedicles. In addition the CT scan also some loosening of his left S1 screw and possible pseudoarthrosis L5-S1. Due to his failure conservative treatment imaging findings and progression of clinical syndrome I recommended a reexposed of fusion removal of hardware and redo fusion L5-S1 and also a decompression and stabilization at L2-3 L3-4. I have extensively reviewed the risks and benefits of the operation the patient as well as perioperative course expectations of outcome and alternatives of surgery and he understood and agreed to proceed forward.  Past Medical History  Diagnosis Date  . CAD (coronary artery disease)     s/p cabg\htn  . HLD (hyperlipidemia)   . Myocardial infarction   . Hypertension   . Diabetes mellitus without complication   . Arthritis   . Anemia     Past Surgical History  Procedure Laterality Date  . Cardiac catheterization  2004  . Coronary artery bypass graft  1992  . Appendectomy      Family History  Problem Relation Age of Onset  . Heart disease     Social History:  reports that he quit smoking about 22 years ago. He does not have any smokeless tobacco history on file. He reports that he does not drink alcohol or use illicit drugs.  Allergies:  Allergies  Allergen Reactions  . Statins Other (See Comments)    Muscle cramps     Medications Prior to Admission  Medication Sig Dispense  Refill  . amLODipine (NORVASC) 10 MG tablet Take 10 mg by mouth daily.      Marland Kitchen aspirin 325 MG tablet Take 325 mg by mouth daily.        . fish oil-omega-3 fatty acids 1000 MG capsule Take 2 g by mouth 2 (two) times daily.        . Garlic 1000 MG CAPS Take 2 capsules by mouth 2 (two) times daily.        Marland Kitchen glipiZIDE (GLUCOTROL XL) 2.5 MG 24 hr tablet Take 2.5 mg by mouth daily.      . traMADol (ULTRAM) 50 MG tablet Take 50-100 mg by mouth every 6 (six) hours as needed for pain.      . nitroGLYCERIN (NITROSTAT) 0.4 MG SL tablet Place 0.4 mg under the tongue every 5 (five) minutes as needed.          Results for orders placed during the hospital encounter of 05/07/13 (from the past 48 hour(s))  PREPARE PLATELET PHERESIS     Status: None   Collection Time    05/06/13  6:30 PM      Result Value Range   Unit Number [id number]     Blood Component Type PLTPHER LR1     Unit division 00     Status of Unit REL FROM [facility]     Transfusion Status OK TO TRANSFUSE     Unit Number [id number]     Blood Component Type PLTPHER LR2  Unit division 00     Status of Unit ALLOCATED     Transfusion Status OK TO TRANSFUSE    GLUCOSE, CAPILLARY     Status: None   Collection Time    05/07/13  6:31 AM      Result Value Range   Glucose-Capillary 88  70 - 99 mg/dL  CBC     Status: Abnormal (Preliminary result)   Collection Time    05/07/13  6:32 AM      Result Value Range   WBC PENDING  4.0 - 10.5 K/uL   RBC 5.78  4.22 - 5.81 MIL/uL   Hemoglobin 15.1  13.0 - 17.0 g/dL   HCT 16.1  09.6 - 04.5 %   MCV 77.5 (*) 78.0 - 100.0 fL   MCH 26.1  26.0 - 34.0 pg   MCHC 33.7  30.0 - 36.0 g/dL   RDW 40.9 (*) 81.1 - 91.4 %   Platelets PENDING  150 - 400 K/uL   No results found.  Review of Systems  Constitutional: Negative.   HENT: Negative.   Eyes: Negative.   Respiratory: Negative.   Cardiovascular: Negative.   Gastrointestinal: Negative.   Genitourinary: Negative.   Musculoskeletal: Positive for  myalgias, back pain and joint pain.  Skin: Negative.   Neurological: Positive for tingling and tremors.  Endo/Heme/Allergies: Negative.   Psychiatric/Behavioral: Negative.     Blood pressure 138/71, pulse 58, temperature 98.1 F (36.7 C), temperature source Oral, resp. rate 20, SpO2 99.00%. Physical Exam  Constitutional: He is oriented to person, place, and time. He appears well-developed.  HENT:  Head: Normocephalic.  Eyes: Pupils are equal, round, and reactive to light.  Neck: Normal range of motion.  Cardiovascular: Normal rate.   Respiratory: Effort normal.  GI: Soft.  Neurological: He is alert and oriented to person, place, and time. He has normal strength. GCS eye subscore is 4. GCS verbal subscore is 5. GCS motor subscore is 6.  Reflex Scores:      Brachioradialis reflexes are 0 on the right side and 0 on the left side.      Patellar reflexes are 0 on the right side and 0 on the left side.      Achilles reflexes are 0 on the right side and 0 on the left side. Strength is 5 out of 5 in his iliopsoas, quads, and she's, gastrocs, anterior tibialis, and EHL.     Assessment/Plan 78 year old gentleman presents for decompression stabilization procedure at L2-3 and L3-4 removal of hardware exploration of fusion L5-S1  [name] P 05/07/2013, 8:07 AM

## 2013-05-07 NOTE — Consult Note (Signed)
PULMONARY  / CRITICAL CARE MEDICINE  Name: [name] MRN: [id number] DOB: 09/14/1935    ADMISSION DATE:  05/07/2013 CONSULTATION DATE:  05/07/2013  REFERRING MD :  Wynetta Emery PRIMARY SERVICE: NSGY  CHIEF COMPLAINT:  Medical Management post Lumbar surgery  BRIEF PATIENT DESCRIPTION: 78 y/o male with multiple medical problems including CAD, DM2, HTN who underwent a lengthy lumbar spine surgery on 6/4 for spinal stenosis.  PCCM consulted for medical management.  SIGNIFICANT EVENTS / STUDIES:  6/4 Removal of lumbar spine hardware, decompressive laminectomy, fusion, screw fixation, arthrodesis and drain placement and multiple lumbar spine levels  LINES / TUBES:   CULTURES:   ANTIBIOTICS: 6/4 cefazolin periop  HISTORY OF PRESENT ILLNESS:  78 y/o male with multiple medical problems including CAD, DM2, HTN who underwent a lengthy lumbar spine surgery on 6/4 for spinal stenosis.  PCCM consulted for medical management.  He tolerated the procedure well, was extubated in the OR and was transferred to the ICU.  Since coming to the ICU his pain has been managed with dilaudid.  He denies chest pain, nausea, or dyspnea.    PAST MEDICAL HISTORY :  Past Medical History  Diagnosis Date  . CAD (coronary artery disease)     s/p [id number]  . HLD (hyperlipidemia)   . Myocardial infarction   . Hypertension   . Diabetes mellitus without complication   . Arthritis   . Anemia    Past Surgical History  Procedure Laterality Date  . Cardiac catheterization  2004  . Coronary artery bypass graft  1992  . Appendectomy     Prior to Admission medications   Medication Sig Start Date End Date Taking? Authorizing Provider  amLODipine (NORVASC) 10 MG tablet Take 10 mg by mouth daily.   Yes Historical Provider, MD  aspirin 325 MG tablet Take 325 mg by mouth daily.     Yes Historical Provider, MD  fish oil-omega-3 fatty acids 1000 MG capsule Take 2 g by mouth 2 (two) times daily.     Yes Historical Provider, MD   Garlic 1000 MG CAPS Take 2 capsules by mouth 2 (two) times daily.     Yes Historical Provider, MD  glipiZIDE (GLUCOTROL XL) 2.5 MG 24 hr tablet Take 2.5 mg by mouth daily.   Yes Historical Provider, MD  traMADol (ULTRAM) 50 MG tablet Take 50-100 mg by mouth every 6 (six) hours as needed for pain.   Yes Historical Provider, MD  nitroGLYCERIN (NITROSTAT) 0.4 MG SL tablet Place 0.4 mg under the tongue every 5 (five) minutes as needed.      Historical Provider, MD   Allergies  Allergen Reactions  . Statins Other (See Comments)    Muscle cramps     FAMILY HISTORY:  Family History  Problem Relation Age of Onset  . Heart disease     SOCIAL HISTORY:  reports that he quit smoking about 22 years ago. He does not have any smokeless tobacco history on file. He reports that he does not drink alcohol or use illicit drugs.  REVIEW OF SYSTEMS:   Gen: Denies fever, chills, weight change, fatigue, night sweats HEENT: Denies blurred vision, double vision, hearing loss, tinnitus, sinus congestion, rhinorrhea, sore throat, neck stiffness, dysphagia PULM: Denies shortness of breath, cough, sputum production, hemoptysis, wheezing CV: Denies chest pain, edema, orthopnea, paroxysmal nocturnal dyspnea, palpitations GI: Denies abdominal pain, nausea, vomiting, diarrhea, hematochezia, melena, constipation, change in bowel habits GU: Denies dysuria, hematuria, polyuria, oliguria, urethral discharge Endocrine: Denies hot or cold intolerance,  polyuria, polyphagia or appetite change Derm: Denies rash, dry skin, scaling or peeling skin change Heme: Denies easy bruising, bleeding, bleeding gums Neuro: Denies headache, numbness, weakness, slurred speech, loss of memory or consciousness   SUBJECTIVE:   VITAL SIGNS: Temp:  [97 F (36.1 C)-98.1 F (36.7 C)] 97.7 F (36.5 C) (06/04 1550) Pulse Rate:  [58-106] 70 (06/04 1800) Resp:  [9-28] 13 (06/04 1800) BP: (129-166)/(61-85) 145/63 mmHg (06/04 1800) SpO2:  [95  %-100 %] 100 % (06/04 1800) Weight:  [101.6 kg (223 lb 15.8 oz)] 101.6 kg (223 lb 15.8 oz) (06/04 1600) HEMODYNAMICS:   VENTILATOR SETTINGS:   INTAKE / OUTPUT: Intake/Output     06/04 0701 - 06/05 0700   I.V. (mL/kg) 2600 (25.6)   Blood 813   IV Piggyback 500   Total Intake(mL/kg) 3913 (38.5)   Urine (mL/kg/hr) 345 (0.3)   Blood 1200 (0.9)   Total Output 1545   Net +2368         PHYSICAL EXAMINATION:  General:  Resting comfortably, conversant Neuro:  A&Ox4, cn ii-xii intact, moves all four ext well HEENT:  NCAT, PERRL, EOMi, OP clear Cardiovascular:  RRR, systolic murmur LUSB noted Lungs:  Few crackles R base that clear easily, otherwise clear Abdomen:  BS infrequent, non-tender, non-distended Musculoskeletal:  Normal bulk and tone Skin:  No breakdown  LABS:  Recent Labs Lab 05/07/13 0632 05/07/13 1257 05/07/13 1641  HGB 15.1 13.0 12.9*  WBC 9.9 17.3* 19.7*  PLT 88* 96* 106*  NA  --   --  137  K  --   --  4.6  CL  --   --  104  CO2  --   --  25  GLUCOSE  --   --  193*  BUN  --   --  16  CREATININE  --   --  1.23  CALCIUM  --   --  8.4  INR  --   --  1.16    Recent Labs Lab 05/07/13 0631 05/07/13 1449 05/07/13 1553  GLUCAP 88 161* 171*    CXR: n/a EKG: pending  ASSESSMENT / PLAN:  NEUROLOGIC A:  Spinal stenosis, s/p lengthy lumbar operation on 6/4 P:   -per NSGY  PULMONARY A:No acute issues P:   -monitor O2 saturation -Incentive spirometry, Out of bed ASAP  CARDIOVASCULAR A: Known CAD (CABG 1991, PCI 2011), currently without angina P:  -hold ASA 325mg  given post op state -restart ASA per NSGY -continue home amlodipine -12 lead -tele  RENAL A:  No acute issues P:   -foley out ASAP -monitor UOP  GASTROINTESTINAL A:  No acute issues P:   -advance diet per NSGY  HEMATOLOGIC A:  Thrombocytopenia at baseline, post op wound oozing without brisk hemorrhage P:  -hold ASA -monitor drain output  INFECTIOUS A:  No acute  issues P:   -periop cefazolin per NSGY  ENDOCRINE A:  DM2  P:   -SSI -add home glipizide when eating -carb modified diet when eating    TODAY'S SUMMARY: 78 y/o male with CAD, HTN, DM2 s/p lengthy lumbar procedure on 6/4 without complication, PCCM consulted for medical management.   Fonnie Jarvis Pulmonary and Critical Care Medicine [facility] Pager: [phone]  05/07/2013, 7:57 PM

## 2013-05-07 NOTE — Progress Notes (Signed)
Pt. With periorbital edema

## 2013-05-07 NOTE — Op Note (Signed)
Preoperative diagnosis: Degenerative disc disease lumbar spinal stenosis and severe foraminal stenosis with bilateral L3 and L4 radiculopathies from stenosis at L2-3 and L3-4 comminution multifactorial on top of congenital stenosis. Pseudoarthrosis L5-S1.  Postoperative diagnosis: Same  Procedure: Exploration of fusion removal of hardware L4-S1 with removal of the rods and both S1 pedicle screws.  #2 decompressive lumbar laminectomy L2-3 and L3-4 in excess and requiring more work than would be needed with a standard interbody fusion with complete medial facetectomies and aggressive foraminotomies of the L2-L3 and L4 nerve roots.  #3 posterior lumbar interbody fusion L2-3 L3-4 using the globus caliber expandable peek cages packed with local autograft mixed with DBX and BMP  #4 pedicle screw fixation L2-S1 on the left an L2-L5 on the right using the globus Revere quarter-inch pedicle screw and rod system reusing the Legacy L4 and L5 screws  #5 posterior lateral arthrodesis L2-L L4 bilaterally and L5-S1 on the left using BMP local autograft mixed DBX  #6 is with a large Hemovac drain  Surgeon: Jillyn Hidden Shanese Riemenschneider  Assistant: Tressie Stalker  Anesthesia: Gen.  EBL: 1200   History of present illness: Patient is a very pleasant 51 his M. is a progress worsening back and bilateral leg pain radiating L3 and L4 and also an S1 distribution. Workup with CT scan a year ago showed positive stenosis at L5-S1 an MRI scan recently show progressive stenosis at 2334 patient failed all forms of conservative treatment was recommended decompressive position procedure at L2-3 and L3-4 as well as reexploration of fusion removal of hardware and redo posterior lateral fusion L5-S1 I extensively went over the risks and benefits of the operation the patient as well as perioperative course expectations about alternatives of surgery and he understood and agreed to proceed forward.  Operative procedure: Patient brought into the  or was induced under general anesthesia positioned prone the Wilson frame his back was prepped and draped in routine sterile fashion. Preoperative x-ray localize the appropriate level so after infiltration of 10 cc lidocaine with epi a midline incision was made using his old incision extending cephalad subperiosteal dissection care lamina of L2-L3 and L4 as well as a scar tissues dissected free exposing the hardware from L4-S1. The fusion was inspected L4-5 appear to be solid L5-S1 appear to be loose the rods were disconnected the initially the left-sided screw was removed and this was very loose haling on the CT scan as well as interoperatively without capturing the bone. After adequate exposure the TPS been exposed at L2-3 and 4 bilaterally and the at the old hardware was packed away attached to the decompression spinous processes at L2-L3 removed central decompression was begun work into the scar tissue at L3-4 complete medial facetectomies were performed there was severe hourglass compression of thecal sac at both levels due to facet arthropathy and ligamentous hypertrophy. Radical foraminotomies were performed of the L2-L3 and L4 nerve roots there was a dense amount of the adhesive tissue located on the L4 nerve root against the pedicle this is all teased away exposing the interspaces epidural veins are coagulated and aggressive undercutting of the superior tickling facet of both levels allowed access to the lateral margins of the disc space. After all decompression been completed consisting of pedicle to placement using a high-speed drill pilot holes were drilled cannulated with the awl probed O55 Probed again and a 6 5 x 45 screw inserted at L2 and L3 fluoroscopy was used the step along the way as well as external and  internal bony landmarks and probing from within the pedicle. All this confirmed adequate placement of the screws. During the placement of the right S1 correction left S1 screw x-ray was obtained  showing that the left correction the right S1 screw was fractured this was not apparent on the previous CT scan obtained a year ago suddenly this is happening interval so therefore I removed the screw head in a piece of the fractured screw of the left Height this too deeply embedded in the vertebral body. Aggressive decortication was carried out MTPs and sacral ala and BMP local autograft mixed DBX was packed posterior laterally at L5-S1 prior to placement of the 8 5 x 40 screw on the left. I elected not to place any alar screws or iliac screws on the right. Then after all screws in place and was to see her get meticulous hemostasis was maintained using the interbody work to a 10 distractor was inserted at the disc spaces on the right respectively the spaces cleanout the left and after adequate endplate preparation and cleaning out of the disc space 9 mm expander cages were put in and these were expanded to approximately 13 mm then the right side in a similar fashion disc spaces cleanout central disc was removed central disc and endplates were scraped with Epstein curette rotating cutters local are graft EBI deep mixed with DBX and BMP was all packed centrally and anteriorly and then the cages were inserted. To rule out the right-sided cage were inserted a similar height as the left side. After all the implants and placed posterior fluoroscopy confirmed good position of all screws and implants the wounds and to proceed her get again aggressive decortication was care MTPs or lateral gutters posterior laterally bone was laid down and intermixed with BMP and then the rods were placed all nuts had excellent purchase the one night on the left L3 screw this screw driver itself broke off in the not head and severe for this is removed and replaced. Then a crossing was applied Gelfoam was 08 over the dura the foraminal reinspected again prior to that I know why she was placed the wounds closed in layers with after Vicryl and  the skin was closed with a running 4 septic or benzoin and Steri-Strips were applied patient recovered in stable condition. At the of the case on it counts and sponge counts were correct.

## 2013-05-07 NOTE — Anesthesia Preprocedure Evaluation (Signed)
Anesthesia Evaluation  Patient identified by MRN, date of birth, ID band Patient awake    Reviewed: Allergy & Precautions, H&P , NPO status , Patient's Chart, lab work & pertinent test results  History of Anesthesia Complications Negative for: history of anesthetic complications  Airway Mallampati: II  Neck ROM: Full    Dental  (+) Teeth Intact   Pulmonary neg pulmonary ROS,  breath sounds clear to auscultation        Cardiovascular hypertension, + CAD and + Past MI Rhythm:Regular Rate:Normal     Neuro/Psych    GI/Hepatic   Endo/Other  diabetes  Renal/GU      Musculoskeletal   Abdominal   Peds  Hematology  (+) Blood dyscrasia, , Low platelets and has been on garlic, fish oil supplememts   Anesthesia Other Findings   Reproductive/Obstetrics                           Anesthesia Physical Anesthesia Plan  ASA: III  Anesthesia Plan: General   Post-op Pain Management:    Induction:   Airway Management Planned: Oral ETT  Additional Equipment:   Intra-op Plan:   Post-operative Plan: Extubation in OR  Informed Consent: I have reviewed the patients History and Physical, chart, labs and discussed the procedure including the risks, benefits and alternatives for the proposed anesthesia with the patient or authorized representative who has indicated his/her understanding and acceptance.   Dental advisory given  Plan Discussed with: CRNA and Surgeon  Anesthesia Plan Comments:         Anesthesia Quick Evaluation

## 2013-05-07 NOTE — Preoperative (Signed)
Beta Blockers   Reason not to administer Beta Blockers:Not Applicable 

## 2013-05-07 NOTE — Progress Notes (Signed)
Upon assessment of patient's lumbar dressing and drain it was completely saturated with sanguinous fluid. Dr. Mikal Plane at bedside. I removed the saturated gauze and Dr. Mikal Plane assessed the site. Redressed incision and Dr. Mikal Plane is going to make Dr. Wynetta Emery aware.  Patient remains stable. Will continue to monitor.[name]

## 2013-05-07 NOTE — Anesthesia Postprocedure Evaluation (Signed)
  Anesthesia Post-op Note  Patient: [name]  Procedure(s) Performed: Procedure(s): POSTERIOR LUMBAR FUSION 2 LEVEL (N/A)  Patient Location: PACU  Anesthesia Type:General  Level of Consciousness: awake and oriented  Airway and Oxygen Therapy: Patient Spontanous Breathing  Post-op Pain: mild  Post-op Assessment: Post-op Vital signs reviewed  Post-op Vital Signs: stable  Complications: No apparent anesthesia complications

## 2013-05-08 LAB — PREPARE PLATELET PHERESIS: Unit division: 0

## 2013-05-08 LAB — GLUCOSE, CAPILLARY: Glucose-Capillary: 99 mg/dL (ref 70–99)

## 2013-05-08 LAB — CBC WITH DIFFERENTIAL/PLATELET
HCT: 33.4 % — ABNORMAL LOW (ref 39.0–52.0)
Hemoglobin: 10.8 g/dL — ABNORMAL LOW (ref 13.0–17.0)
Lymphocytes Relative: 5 % — ABNORMAL LOW (ref 12–46)
MCHC: 32.3 g/dL (ref 30.0–36.0)
MCV: 79.1 fL (ref 78.0–100.0)
Monocytes Absolute: 1.6 10*3/uL — ABNORMAL HIGH (ref 0.1–1.0)
Monocytes Relative: 7 % (ref 3–12)
Neutro Abs: 19.7 10*3/uL — ABNORMAL HIGH (ref 1.7–7.7)
WBC: 22.5 10*3/uL — ABNORMAL HIGH (ref 4.0–10.5)

## 2013-05-08 LAB — BASIC METABOLIC PANEL
BUN: 16 mg/dL (ref 6–23)
CO2: 28 mEq/L (ref 19–32)
Chloride: 103 mEq/L (ref 96–112)
GFR calc Af Amer: 79 mL/min — ABNORMAL LOW (ref >90)
Potassium: 5 mEq/L (ref 3.5–5.1)

## 2013-05-08 MED ORDER — PANTOPRAZOLE SODIUM 40 MG PO TBEC
40.0000 mg | DELAYED_RELEASE_TABLET | Freq: Every day | ORAL | Status: DC
  Administered 2013-05-08 – 2013-05-14 (×7): 40 mg via ORAL
  Filled 2013-05-08 (×7): qty 1

## 2013-05-08 NOTE — Progress Notes (Signed)
Pt voided ~ 20cc w/ difficulty, c/o continued bladder discomfort & fullness after attempting to void.  Bladder scan = 861cc.  In & out cath'd for 900cc, pt states discomfort substantially relieved.

## 2013-05-08 NOTE — Consult Note (Signed)
PULMONARY  / CRITICAL CARE MEDICINE  Name: [name] MRN: [id number] DOB: Jul 08, 1935    ADMISSION DATE:  05/07/2013 CONSULTATION DATE:  05/07/2013  REFERRING MD :  Wynetta Emery PRIMARY SERVICE: NSGY  CHIEF COMPLAINT:  Medical Management post Lumbar surgery  BRIEF PATIENT DESCRIPTION: 78 y/o male with multiple medical problems including CAD, DM2, HTN who underwent a lengthy lumbar spine surgery on 6/4 for spinal stenosis.  PCCM consulted for medical management.  SIGNIFICANT EVENTS / STUDIES:  6/4 Removal of lumbar spine hardware, decompressive laminectomy, fusion, screw fixation, arthrodesis and drain placement and multiple lumbar spine levels  LINES / TUBES:  CULTURES:  ANTIBIOTICS: 6/4 cefazolin periop  SUBJECTIVE:  Doing well   VITAL SIGNS: Temp:  [97 F (36.1 C)-98.3 F (36.8 C)] 97.8 F (36.6 C) (06/05 0754) Pulse Rate:  [62-106] 78 (06/05 0930) Resp:  [9-28] 18 (06/05 0930) BP: (106-166)/(48-87) 120/52 mmHg (06/05 0900) SpO2:  [95 %-100 %] 97 % (06/05 0930) FiO2 (%):  [21 %] 21 % (06/05 0754) Weight:  [223 lb 15.8 oz (101.6 kg)] 223 lb 15.8 oz (101.6 kg) (06/04 1600) HEMODYNAMICS:   VENTILATOR SETTINGS: Vent Mode:  [-]  FiO2 (%):  [21 %] 21 % INTAKE / OUTPUT: Intake/Output     06/04 0701 - 06/05 0700 06/05 0701 - 06/06 0700   P.O.  180   I.V. (mL/kg) 3635 (35.8) 150 (1.5)   Blood 813    Other 360    IV Piggyback 800    Total Intake(mL/kg) 5608 (55.2) 330 (3.2)   Urine (mL/kg/hr) 1245 (0.5) 150 (0.5)   Drains 20 (0)    Blood 1200 (0.5)    Total Output 2465 150   Net +3143 +180          PHYSICAL EXAMINATION:  General:  Resting comfortably, conversant in bed Neuro:  A&Ox4, cn ii-xii intact, moves all four ext well, good strength HEENT:  NCAT, PERRL, EOMi, OP clear Cardiovascular:  RRR, systolic murmur LUSB noted Lungs:  CTA to bases Abdomen:  BS infrequent, non-tender, non-distended Musculoskeletal:  Normal bulk and tone Skin:  No  breakdown  LABS:  Recent Labs Lab 05/07/13 1257 05/07/13 1641 05/08/13 0740  HGB 13.0 12.9* 10.8*  WBC 17.3* 19.7* 22.5*  PLT 96* 106* 106*  NA  --  137 139  K  --  4.6 5.0  CL  --  104 103  CO2  --  25 28  GLUCOSE  --  193* 114*  BUN  --  16 16  CREATININE  --  1.23 1.04  CALCIUM  --  8.4 8.3*  INR  --  1.16  --     Recent Labs Lab 05/07/13 0631 05/07/13 1449 05/07/13 1553 05/07/13 2203 05/08/13 0816  GLUCAP 88 161* 171* 152* 98    CXR: n/a EKG: pending  ASSESSMENT / PLAN:  NEUROLOGIC A:  Spinal stenosis, s/p lengthy lumbar operation on 6/4 P:   -per NSGY -wound care per NS, some oozing  PULMONARY A: No evidence edema P:   -monitor O2 saturation on RA -Incentive spirometry, Out of bed today, check sats then -pre op pcxr reviwed, witout edema  CARDIOVASCULAR A: Known CAD (CABG 1991, PCI 2011), currently without angina P:  -restart ASA per NSGY -continue home amlodipine -12 lead not noted, reorder this -tele  RENAL A: Borderline hyperkalemia P:   -foley out today -monitor UOP -chem in am repeat for K, follow output closely -low threshold dc tramadol if K rises further -no role kayxlate,  if no BM could use Dc K in bag fluid  GASTROINTESTINAL A:  No acute issues P:   -advance diet per NSGY  HEMATOLOGIC A:  Thrombocytopenia at baseline, post op wound oozing without brisk hemorrhage P:  -hold ASA -monitor drain output and wound output  INFECTIOUS A:  No acute issues P:   -periop cefazolin per NSGY  ENDOCRINE A:  DM2, controlled overall P:   -SSI -home glipizide when eating -carb modified diet Goal NICE 140-180 met   TODAY'S SUMMARY: 78 y/o male with CAD, HTN, DM2 s/p lengthy lumbar procedure on 6/4 without complication, Mild K up, repeat in am, follow urine output  Rory Percy J.,MD Pulmonary and Critical Care Medicine [facility] Pager: [phone]  05/08/2013, 9:59 AM

## 2013-05-08 NOTE — Evaluation (Signed)
Physical Therapy Evaluation Patient Details Name: [name] MRN: [id number] DOB: 1935-11-02 Today's Date: 05/08/2013 Time: 1022-1050 PT Time Calculation (min): 28 min  PT Assessment / Plan / Recommendation Clinical Impression  Patient is a 78 y/o male s/p PLIF L2-L4 , hardware removal of L4-S1 and decompressive lumbar lami L2-L4.  He presents with decreased independence with mobility due to acute pain, decreased balance, decreased knowledge of precautions and limited tolerance to activity.  He will benefit from skilled PT in the acute setting to maximize independence and allow return home with family supervision and HHPT.    PT Assessment  Patient needs continued PT services    Follow Up Recommendations  Home health PT       Barriers to Discharge Decreased caregiver support wife likely can only supervise mobility    Equipment Recommendations  None recommended by PT       Frequency Min 5X/week    Precautions / Restrictions Precautions Precautions: Back Precaution Comments: Educated pt on back precautions.   Pertinent Vitals/Pain 5-7/10 increased during mobility in back      Mobility  Bed Mobility Bed Mobility: Rolling Right;Right Sidelying to Sit;Sitting - Scoot to [place] of Bed Rolling Right: 4: Min assist;With rail Right Sidelying to Sit: 3: Mod assist;With rails Sitting - Scoot to Edge of Bed: 4: Min assist Details for Bed Mobility Assistance: VCs for sequencing of log roll technique. Assist to elevate trunk off bed. Transfers Sit to Stand: 3: Mod assist;From bed;With upper extremity assist Stand to Sit: 4: Min assist;To chair/3-in-1;With armrests;With upper extremity assist Details for Transfer Assistance: Assist for power up from bed and for steadying due to posterior lean (supporting LEs by leaning posteriorly against bed). VCs for hand placement. Ambulation/Gait Ambulation/Gait Assistance: 4: Min assist Ambulation Distance (Feet): 70 Feet Assistive device:  Rolling walker Ambulation/Gait Assistance Details: assist for walker management, cues to stay in walker and for posture.   Gait Pattern: Decreased stride length;Trunk flexed;Shuffle        PT Diagnosis: Difficulty walking;Generalized weakness;Acute pain  PT Problem List: Decreased strength;Decreased activity tolerance;Decreased balance;Decreased knowledge of use of DME;Decreased knowledge of precautions;Decreased mobility;Pain PT Treatment Interventions: DME instruction;Gait training;Stair training;Therapeutic exercise;Therapeutic activities;Patient/family education;Functional mobility training   PT Goals Acute Rehab PT Goals PT Goal Formulation: With patient/family Time For Goal Achievement: 05/16/13 Potential to Achieve Goals: Good Pt will Roll Supine to Left Side: with modified independence PT Goal: Rolling Supine to Left Side - Progress: Goal set today Pt will go Supine/Side to Sit: with modified independence PT Goal: Supine/Side to Sit - Progress: Goal set today Pt will go Sit to Supine/Side: with modified independence PT Goal: Sit to Supine/Side - Progress: Goal set today Pt will go Sit to Stand: with modified independence PT Goal: Sit to Stand - Progress: Goal set today Pt will Ambulate: >150 feet;with rolling walker;with supervision PT Goal: Ambulate - Progress: Goal set today Pt will Go Up / Down Stairs: with rail(s);with supervision;3-5 stairs PT Goal: Up/Down Stairs - Progress: Goal set today Additional Goals Additional Goal #1: Patient will verbalize back precautions independently. PT Goal: Additional Goal #1 - Progress: Goal set today  Visit Information  Last PT Received On: 05/08/13 Assistance Needed: +1 PT/OT Co-Evaluation/Treatment: Yes    Subjective Data  Subjective: Don't feel the best.  Was hoping leg wouldn't be numb after surgery. Patient Stated Goal: To return home to independent   Prior Functioning  Home Living Lives With: Spouse Available Help at  Discharge: Family Type of Home: House  Home Access: Stairs to enter [employer] of Steps: 4 Entrance Stairs-Rails: Left;Right;Can reach both Home Layout: One level Bathroom Shower/Tub: Tub/shower unit;Door [employer] Toilet: Standard Home Adaptive Equipment: Straight cane;Walker - rolling;Bedside commode/3-in-1 Prior Function Level of Independence: Independent with assistive device(s) Able to Take Stairs?: Yes Driving: Yes Vocation: Retired Musician: No difficulties Dominant Hand: Right    Cognition  Cognition Arousal/Alertness: Awake/alert Behavior During Therapy: WFL for tasks assessed/performed Overall Cognitive Status: Within Functional Limits for tasks assessed    Extremity/Trunk Assessment Right Upper Extremity Assessment RUE ROM/Strength/Tone: WFL for tasks assessed Left Upper Extremity Assessment LUE ROM/Strength/Tone: WFL for tasks assessed Right Lower Extremity Assessment RLE ROM/Strength/Tone: WFL for tasks assessed RLE Sensation: WFL - Light Touch Left Lower Extremity Assessment LLE ROM/Strength/Tone: WFL for tasks assessed LLE Sensation: Deficits LLE Sensation Deficits: numbness in left foot and little in lower leg   Balance Balance Balance Assessed: Yes Static Sitting Balance Static Sitting - Balance Support: Feet supported;No upper extremity supported Static Sitting - Level of Assistance: 5: Stand by assistance Static Standing Balance Static Standing - Balance Support: Bilateral upper extremity supported Static Standing - Level of Assistance: 4: Min assist Static Standing - Comment/# of Minutes: assist for steadying due to posterior lean initially standing with walker  End of Session PT - End of Session Equipment Utilized During Treatment: Gait belt Activity Tolerance: Patient limited by pain Patient left: in chair;with call bell/phone within reach;with family/visitor present Nurse Communication: Mobility status  GP      [facility] 05/08/2013, 11:49 AM Sheran Lawless, PT [phone] 05/08/2013

## 2013-05-08 NOTE — Progress Notes (Signed)
SaO2 during ambulation in hall = 98%.

## 2013-05-08 NOTE — Evaluation (Signed)
Occupational Therapy Evaluation Patient Details Name: [name] MRN: [id number] DOB: September 26, 1935 Today's Date: 05/08/2013 Time: 4098-1191 OT Time Calculation (min): 19 min  OT Assessment / Plan / Recommendation Clinical Impression  Pt s/p PLIF L2-L4 , hardware removal of L4-S1 and decompressive lumbar lami L2-L4. Will continue to follow acutely in order to address below problem list in prep for return home.    OT Assessment  Patient needs continued OT Services    Follow Up Recommendations  Home health OT;Supervision/Assistance - 24 hour    Barriers to Discharge      Equipment Recommendations  Tub/shower bench    Recommendations for Other Services    Frequency  Min 2X/week    Precautions / Restrictions Precautions Precautions: Back Precaution Comments: Educated pt on back precautions.   Pertinent Vitals/Pain See vitals    ADL  Eating/Feeding: Performed;Independent Where Assessed - Eating/Feeding: Edge of bed Upper Body Bathing: Simulated;Supervision/safety Where Assessed - Upper Body Bathing: Unsupported sitting Lower Body Bathing: Simulated;Moderate assistance Where Assessed - Lower Body Bathing: Supported sit to stand Upper Body Dressing: Performed;Minimal assistance Where Assessed - Upper Body Dressing: Unsupported sitting Lower Body Dressing: Simulated;Maximal assistance Where Assessed - Lower Body Dressing: Supported sit to stand Toilet Transfer: Simulated;Moderate assistance Toilet Transfer Method: Sit to stand (ambulating) Toilet Transfer Equipment:  (bed, ambulated in hall then to chair in room) Equipment Used: Gait belt;Rolling walker Transfers/Ambulation Related to ADLs: min assist with RW.  Incr time due to pain and LE stiffness. ADL Comments: Pt requiring increased time for all tasks due to pain.    OT Diagnosis: Generalized weakness;Acute pain  OT Problem List: Decreased strength;Decreased activity tolerance;Impaired balance (sitting and/or  standing);Decreased knowledge of use of DME or AE;Decreased knowledge of precautions;Pain OT Treatment Interventions: Self-care/ADL training;DME and/or AE instruction;Therapeutic activities;Patient/family education;Balance training   OT Goals Acute Rehab OT Goals OT Goal Formulation: With patient Time For Goal Achievement: 05/15/13 Potential to Achieve Goals: Good ADL Goals Pt Will Perform Grooming: with modified independence;Standing at sink ADL Goal: Grooming - Progress: Goal set today Pt Will Perform Lower Body Bathing: with modified independence;Sit to stand from chair;Sit to stand from bed;with adaptive equipment ADL Goal: Lower Body Bathing - Progress: Goal set today Pt Will Perform Lower Body Dressing: with modified independence;Sit to stand from chair;Sit to stand from bed;with adaptive equipment ADL Goal: Lower Body Dressing - Progress: Goal set today Pt Will Transfer to Toilet: with modified independence;Ambulation;with DME;Comfort height toilet;Maintaining back safety precautions ADL Goal: Toilet Transfer - Progress: Goal set today Pt Will Perform Toileting - Clothing Manipulation: with modified independence;Standing ADL Goal: Toileting - Clothing Manipulation - Progress: Goal set today Pt Will Perform Toileting - Hygiene: with modified independence;Sit to stand from 3-in-1/toilet ADL Goal: Toileting - Hygiene - Progress: Goal set today Miscellaneous OT Goals Miscellaneous OT Goal #1: Pt will perform bed mobility at mod I level as precursor for EOB ADLs. OT Goal: Miscellaneous Goal #1 - Progress: Goal set today  Visit Information  Last OT Received On: 05/08/13 Assistance Needed: +1 PT/OT Co-Evaluation/Treatment: Yes    Subjective Data      Prior Functioning     Home Living Lives With: Spouse Available Help at Discharge: Family Type of Home: House Home Access: Stairs to enter [employer] of Steps: 4 Entrance Stairs-Rails: Left;Right;Can reach both Home  Layout: One level Bathroom Shower/Tub: Tub/shower unit;Door [employer] Toilet: Standard Home Adaptive Equipment: Straight cane;Walker - rolling;Bedside commode/3-in-1 Prior Function Level of Independence: Independent with assistive device(s) Able to Take Stairs?:  Yes Driving: Yes Vocation: Retired Musician: No difficulties Dominant Hand: Right         Vision/Perception Vision - History Baseline Vision: Wears glasses all the time Patient Visual Report: No change from baseline   Cognition  Cognition Arousal/Alertness: Awake/alert Behavior During Therapy: WFL for tasks assessed/performed Overall Cognitive Status: Within Functional Limits for tasks assessed    Extremity/Trunk Assessment Right Upper Extremity Assessment RUE ROM/Strength/Tone: WFL for tasks assessed Left Upper Extremity Assessment LUE ROM/Strength/Tone: WFL for tasks assessed     Mobility Bed Mobility Bed Mobility: Rolling Right;Right Sidelying to Sit;Sitting - Scoot to [place] of Bed Rolling Right: 4: Min assist;With rail Right Sidelying to Sit: 3: Mod assist;With rails Sitting - Scoot to Edge of Bed: 4: Min assist Details for Bed Mobility Assistance: VCs for sequencing of log roll technique. Assist to elevate trunk off bed. Transfers Transfers: Sit to Stand;Stand to Sit Sit to Stand: 3: Mod assist;From bed;With upper extremity assist Stand to Sit: 4: Min assist;To chair/3-in-1;With armrests;With upper extremity assist Details for Transfer Assistance: Assist for power up from bed and for steadying due to posterior lean (supporting LEs by leaning posteriorly against bed). VCs for hand placement.     Exercise     Balance Balance Balance Assessed: Yes Static Sitting Balance Static Sitting - Balance Support: Feet supported;No upper extremity supported Static Sitting - Level of Assistance: 5: Stand by assistance Static Standing Balance Static Standing - Balance Support: Bilateral upper  extremity supported Static Standing - Level of Assistance: 4: Min assist Static Standing - Comment/# of Minutes: Assist for steadying due to posterior lean with bil UEs supported on RW.   End of Session OT - End of Session Equipment Utilized During Treatment: Gait belt Activity Tolerance: Patient tolerated treatment well Patient left: in chair;with call bell/phone within reach;with family/visitor present Nurse Communication: Mobility status  GO   05/08/2013 Cipriano Mile OTR/L Pager [phone] Office [phone]   Cipriano Mile 05/08/2013, 11:31 AM

## 2013-05-08 NOTE — Progress Notes (Signed)
Subjective: Patient reports May still much better this morning last night he denies any radicular pain is legs feel much better than it did preop his back pain is well managed on the oxycodone and Flexeril  Objective: Vital signs in last 24 hours: Temp:  [97 F (36.1 C)-98.3 F (36.8 C)] 97.9 F (36.6 C) (06/05 0419) Pulse Rate:  [62-106] 63 (06/05 0600) Resp:  [9-28] 12 (06/05 0600) BP: (111-166)/(48-87) 120/50 mmHg (06/05 0600) SpO2:  [95 %-100 %] 99 % (06/05 0600) Weight:  [101.6 kg (223 lb 15.8 oz)] 101.6 kg (223 lb 15.8 oz) (06/04 1600)  Intake/Output from previous day: 06/04 0701 - 06/05 0700 In: 5533 [I.V.:3560; Blood:813; IV Piggyback:800] Out: 2445 [Urine:1245; Blood:1200] Intake/Output this shift:    Awake alert oriented strength 5 out of 5 wound is clean and dry J-P output minimal  Lab Results:  Recent Labs  05/07/13 1257 05/07/13 1641  WBC 17.3* 19.7*  HGB 13.0 12.9*  HCT 39.4 38.9*  PLT 96* 106*   BMET  Recent Labs  05/07/13 1641  NA 137  K 4.6  CL 104  CO2 25  GLUCOSE 193*  BUN 16  CREATININE 1.23  CALCIUM 8.4    Studies/Results: Dg Lumbar Spine Complete  05/07/2013   *RADIOLOGY REPORT*  Clinical Data: Lumbar fusion.  Back pain.  DG C-ARM 61-120 MIN,LUMBAR SPINE - COMPLETE 4+ VIEW  Comparison: 02/06/2013 MRI  Findings:  C-arm films document extension of lumbar fusion to involve L2-L4. Pedicle screws have been placed at L2, L3, and L4 along with interbody cages at L2-3 and L3-4.  IMPRESSION: As above.   Original Report Authenticated By: Davonna Belling, M.D.   Dg C-arm [phone] Min  05/07/2013   *RADIOLOGY REPORT*  Clinical Data: Lumbar fusion.  Back pain.  DG C-ARM 61-120 MIN,LUMBAR SPINE - COMPLETE 4+ VIEW  Comparison: 02/06/2013 MRI  Findings:  C-arm films document extension of lumbar fusion to involve L2-L4. Pedicle screws have been placed at L2, L3, and L4 along with interbody cages at L2-3 and L3-4.  IMPRESSION: As above.   Original Report  Authenticated By: Davonna Belling, M.D.    Assessment/Plan: Posterior day 1 from a decompression stabilization procedure from L2-S1 postoperatively patient seems to be doing very well we'll continue to progressively mobilized today with physical and outpatient therapy we'll continue observation in the ICU 1 more day.  LOS: 1 day     Jhovani Griswold P 05/08/2013, 7:03 AM

## 2013-05-08 NOTE — Progress Notes (Signed)
Utilization review completed.  

## 2013-05-09 LAB — BASIC METABOLIC PANEL
BUN: 15 mg/dL (ref 6–23)
Calcium: 8.2 mg/dL — ABNORMAL LOW (ref 8.4–10.5)
Chloride: 102 mEq/L (ref 96–112)
Creatinine, Ser: 0.93 mg/dL (ref 0.50–1.35)
GFR calc Af Amer: >90 mL/min (ref >90)
GFR calc non Af Amer: 80 mL/min — ABNORMAL LOW (ref >90)

## 2013-05-09 LAB — GLUCOSE, CAPILLARY
Glucose-Capillary: 105 mg/dL — ABNORMAL HIGH (ref 70–99)
Glucose-Capillary: 126 mg/dL — ABNORMAL HIGH (ref 70–99)

## 2013-05-09 LAB — PHOSPHORUS: Phosphorus: 2.3 mg/dL (ref 2.3–4.6)

## 2013-05-09 MED ORDER — POTASSIUM CHLORIDE CRYS ER 20 MEQ PO TBCR
20.0000 meq | EXTENDED_RELEASE_TABLET | Freq: Once | ORAL | Status: AC
  Administered 2013-05-09: 20 meq via ORAL
  Filled 2013-05-09: qty 1

## 2013-05-09 NOTE — Progress Notes (Signed)
Subjective: Patient reports Is doing well legs still feel better than preop no new numbness or tingling back pain is manageable  Objective: Vital signs in last 24 hours: Temp:  [97.4 F (36.3 C)-98.6 F (37 C)] 98.6 F (37 C) (06/06 0400) Pulse Rate:  [63-105] 89 (06/06 0700) Resp:  [10-23] 11 (06/06 0700) BP: (101-141)/(47-77) 115/50 mmHg (06/06 0700) SpO2:  [92 %-100 %] 98 % (06/06 0700) FiO2 (%):  [21 %] 21 % (06/05 0754)  Intake/Output from previous day: 06/05 0701 - 06/06 0700 In: 1501.3 [P.O.:895; I.V.:356.3; IV Piggyback:150] Out: 2530 [Urine:2530] Intake/Output this shift:    Awake alert oriented strength 5 out of 5 wound clean and dry  Lab Results:  Recent Labs  05/07/13 1641 05/08/13 0740  WBC 19.7* 22.5*  HGB 12.9* 10.8*  HCT 38.9* 33.4*  PLT 106* 106*   BMET  Recent Labs  05/08/13 0740 05/09/13 0605  NA 139 139  K 5.0 3.4*  CL 103 102  CO2 28 30  GLUCOSE 114* 104*  BUN 16 15  CREATININE 1.04 0.93  CALCIUM 8.3* 8.2*    Studies/Results: Dg Lumbar Spine Complete  05/07/2013   *RADIOLOGY REPORT*  Clinical Data: Lumbar fusion.  Back pain.  DG C-ARM 61-120 MIN,LUMBAR SPINE - COMPLETE 4+ VIEW  Comparison: 02/06/2013 MRI  Findings:  C-arm films document extension of lumbar fusion to involve L2-L4. Pedicle screws have been placed at L2, L3, and L4 along with interbody cages at L2-3 and L3-4.  IMPRESSION: As above.   Original Report Authenticated By: Davonna Belling, M.D.   Dg C-arm [phone] Min  05/07/2013   *RADIOLOGY REPORT*  Clinical Data: Lumbar fusion.  Back pain.  DG C-ARM 61-120 MIN,LUMBAR SPINE - COMPLETE 4+ VIEW  Comparison: 02/06/2013 MRI  Findings:  C-arm films document extension of lumbar fusion to involve L2-L4. Pedicle screws have been placed at L2, L3, and L4 along with interbody cages at L2-3 and L3-4.  IMPRESSION: As above.   Original Report Authenticated By: Davonna Belling, M.D.    Assessment/Plan: Posterior day 2 from a 2-3, and 3-4 plif  progressing well. Continue mobilization continued Hemovac drain physical outpatient therapy  LOS: 2 days     Alysha Doolan P 05/09/2013, 7:52 AM

## 2013-05-09 NOTE — Progress Notes (Signed)
Patient attempted to void in urinal by standing at the edge of the bed with no success. Bladder scan revealed 341 cc in bladder. Per Dr. Lonie Peak verbal order earlier in the shift replace foley if urinary retention occurs again. Foley catheter placed using sterile technique. >350 cc of yellow urine immediately gathered in return.  Will continue to monitor patient. [name]

## 2013-05-09 NOTE — Progress Notes (Signed)
Physical Therapy Treatment Patient Details Name: [name] MRN: [id number] DOB: 1935/01/02 Today's Date: 05/09/2013 Time: 4098-1191 PT Time Calculation (min): 18 min  PT Assessment / Plan / Recommendation Comments on Treatment Session  78 y.o. male admitted to [facility] for L2-4 PLIF.  He is now POD #2 moving slowly with left leg weakness and heavy relinace on upper extremities for mobility.  He states that his wife is able to physically help him, but he is progressing slowly.      Follow Up Recommendations  Home health PT;Supervision/Assistance - 24 hour     Does the patient have the potential to tolerate intense rehabilitation    yes  Barriers to Discharge   none      Equipment Recommendations  None recommended by PT    Recommendations for Other Services   none  Frequency Min 5X/week   Plan Discharge plan remains appropriate;Frequency remains appropriate    Precautions / Restrictions Precautions Precautions: Back Precaution Comments: reviewed log roll technique and brace [place] Required Braces or Orthoses: Spinal Brace Spinal Brace: Applied in sitting position;Lumbar corset (back brace per RN "showed up" yesterday depite lack of order)   Pertinent Vitals/Pain See vitals flow sheet.     Mobility  Bed Mobility Bed Mobility: Rolling Right;Right Sidelying to Sit;Sitting - Scoot to [employer] of Bed Rolling Right: 4: Min assist;With rail Right Sidelying to Sit: 4: Min assist;With rails;HOB flat Sitting - Scoot to [place] of Bed: 4: Min assist;With rail Details for Bed Mobility Assistance: heavy reliance on railing for support, verbal cues for log roll technique Transfers Transfers: Sit to Stand;Stand to Sit Sit to Stand: 3: Mod assist;From elevated surface;With upper extremity assist;With armrests;From bed Stand to Sit: 3: Mod assist;With upper extremity assist;With armrests;To chair/3-in-1 Details for Transfer Assistance: mod assist to support trunk over weak legs.  Pt very stiff upon  standing with flexed trunk and knees.   Ambulation/Gait Ambulation/Gait Assistance: 3: Mod assist Ambulation Distance (Feet): 70 Feet Assistive device: Rolling walker Ambulation/Gait Assistance Details: mod assist to support trunk during gait and help steer RW. Pt with weaker and buckling left leg.  Max verbal cues to stay closer and inside of RW during gait and for upright posture.   Gait Pattern: Step-through pattern;Trunk flexed Gait velocity: less than 1.8 ft/sec indicating risk for recurrent falls.       PT Goals Acute Rehab PT Goals PT Goal: Supine/Side to Sit - Progress: Progressing toward goal PT Goal: Sit to Stand - Progress: Progressing toward goal PT Goal: Ambulate - Progress: Not progressing  Visit Information  Last PT Received On: 05/09/13 Assistance Needed: +1    Subjective Data  Subjective: Pt states he has not gotten up OOB since therapy saw him yesterday.   Patient Stated Goal: To return home to independent   Cognition  Cognition Arousal/Alertness: Awake/alert Behavior During Therapy: WFL for tasks assessed/performed Overall Cognitive Status: Within Functional Limits for tasks assessed    Balance  Static Sitting Balance Static Sitting - Balance Support: Bilateral upper extremity supported;Feet supported Static Sitting - Level of Assistance: 5: Stand by assistance Static Standing Balance Static Standing - Balance Support: Bilateral upper extremity supported Static Standing - Level of Assistance: 4: Min assist Static Standing - Comment/# of Minutes: standing still holding to RW only min assist, dynamically mod assist.    End of Session PT - End of Session Equipment Utilized During Treatment: Back brace Activity Tolerance: Patient limited by fatigue;Patient limited by pain Patient left: in chair;with call bell/phone  within reach Nurse Communication: Mobility status       Lurena Joiner B. Aemilia Dedrick, PT, DPT [phone]   05/09/2013, 1:49 PM

## 2013-05-09 NOTE — Progress Notes (Signed)
Occupational Therapy Treatment Patient Details Name: [name] MRN: [id number] DOB: [birth year] Today's Date: 05/09/2013 Time: 4132-4401 OT Time Calculation (min): 11 min  OT Assessment / Plan / Recommendation Comments on Treatment Session Pt demonstrating decreased activity tolerance today.  Will continue to recommend HHOT but may need to update plan to SNF if pt does not progress.    Follow Up Recommendations  Home health OT;Supervision/Assistance - 24 hour    Barriers to Discharge       Equipment Recommendations  Tub/shower bench    Recommendations for Other Services    Frequency Min 2X/week   Plan Discharge plan remains appropriate    Precautions / Restrictions Precautions Precautions: Back Precaution Comments: reviewed log roll technique and brace [place] Required Braces or Orthoses: Spinal Brace Spinal Brace: Applied in sitting position;Lumbar corset (back brace per RN "showed up" yesterday depite lack of order)   Pertinent Vitals/Pain See vitals    ADL  Toilet Transfer: Simulated;Moderate assistance Toilet Transfer Method: Sit to Barista:  (bed) Equipment Used: Gait belt;Rolling walker Transfers/Ambulation Related to ADLs: Mod assist for sit<>stand and to take side steps to [facility] with RW. ADL Comments: Pt limited by pain and fatigue.  Inititally upon OT arrival, pt not wishing to get OOB for therapy session.  Pt slid down in bed and needing to be repositioned.  Pt agreeable to sitting up EOB and then taking side steps to [facility].    OT Diagnosis:    OT Problem List:   OT Treatment Interventions:     OT Goals ADL Goals Pt Will Transfer to Toilet: with modified independence;Ambulation;with DME;Comfort height toilet;Maintaining back safety precautions ADL Goal: Toilet Transfer - Progress: Progressing toward goals Miscellaneous OT Goals Miscellaneous OT Goal #1: Pt will perform bed mobility at mod I level as precursor for EOB ADLs. OT Goal:  Miscellaneous Goal #1 - Progress: Progressing toward goals  Visit Information  Last OT Received On: 05/09/13 Assistance Needed: +1    Subjective Data      Prior Functioning       Cognition  Cognition Arousal/Alertness: Awake/alert Behavior During Therapy: WFL for tasks assessed/performed Overall Cognitive Status: Within Functional Limits for tasks assessed    Mobility  Bed Mobility Bed Mobility: Rolling Right;Right Sidelying to Sit;Sit to Sidelying Right Rolling Right: 4: Min assist;With rail Right Sidelying to Sit: 4: Min assist;With rails;HOB elevated Sitting - Scoot to Edge of Bed: 4: Min assist Sit to Sidelying Right: 4: Min assist;With rail;HOB flat Details for Bed Mobility Assistance: VCs for log roll sequencing. Transfers Transfers: Sit to Stand;Stand to Sit Sit to Stand: 3: Mod assist;From bed;With upper extremity assist Stand to Sit: 4: Min assist;To bed;With upper extremity assist Details for Transfer Assistance: Assist for power up from bed and steadying for balance.    Exercises      Balance    End of Session OT - End of Session Equipment Utilized During Treatment: Gait belt Activity Tolerance: Patient limited by fatigue;Patient limited by pain Patient left: in bed;with call bell/phone within reach  GO    05/09/2013 Cipriano Mile OTR/L Pager [phone] Office [phone]  Cipriano Mile 05/09/2013, 4:25 PM

## 2013-05-09 NOTE — Consult Note (Signed)
PULMONARY  / CRITICAL CARE MEDICINE  Name: [name] MRN: [id number] DOB: 1935-07-03    ADMISSION DATE:  05/07/2013 CONSULTATION DATE:  05/07/2013  REFERRING MD :  Wynetta Emery PRIMARY SERVICE: NSGY  CHIEF COMPLAINT:  Medical Management post Lumbar surgery  BRIEF PATIENT DESCRIPTION: 78 y/o male with multiple medical problems including CAD, DM2, HTN who underwent a lengthy lumbar spine surgery on 6/4 for spinal stenosis.  PCCM consulted for medical management.  SIGNIFICANT EVENTS / STUDIES:  6/4 Removal of lumbar spine hardware, decompressive laminectomy, fusion, screw fixation, arthrodesis and drain placement and multiple lumbar spine levels  LINES / TUBES: Back   CULTURES:  ANTIBIOTICS: 6/4 cefazolin periop  SUBJECTIVE:  Doing well Had neg 1 liter balance, K down  VITAL SIGNS: Temp:  [97.4 F (36.3 C)-98.6 F (37 C)] 98 F (36.7 C) (06/06 0800) Pulse Rate:  [71-105] 96 (06/06 0925) Resp:  [10-23] 17 (06/06 0925) BP: (101-141)/(47-77) 121/73 mmHg (06/06 0900) SpO2:  [92 %-99 %] 95 % (06/06 0925) HEMODYNAMICS:   VENTILATOR SETTINGS:   INTAKE / OUTPUT: Intake/Output     06/05 0701 - 06/06 0700 06/06 0701 - 06/07 0700   P.O. 895 180   I.V. (mL/kg) 356.3 (3.5)    Blood     Other 100    IV Piggyback 150    Total Intake(mL/kg) 1501.3 (14.8) 180 (1.8)   Urine (mL/kg/hr) 2530 (1) 80 (0.3)   Drains     Blood     Total Output 2530 80   Net -1028.8 +100          PHYSICAL EXAMINATION:  General:  No distress Neuro:  A&Ox4, cn ii-xii intact, moves all four ext well, good strength HEENT:  NCAT, jvd wnl Cardiovascular:  RRR, systolic murmur LUSB noted Lungs:  CTA to bases Abdomen:  BS infrequent, non-tender, non-distended Skin:  No breakdown Power lowers wnl  LABS:  Recent Labs Lab 05/07/13 1257  05/07/13 1641 05/08/13 0740 05/09/13 0605  HGB 13.0  --  12.9* 10.8*  --   WBC 17.3*  --  19.7* 22.5*  --   PLT 96*  --  106* 106*  --   NA  --   --  137 139 139  K   --   < > 4.6 5.0 3.4*  CL  --   --  104 103 102  CO2  --   --  25 28 30   GLUCOSE  --   --  193* 114* 104*  BUN  --   --  16 16 15   CREATININE  --   --  1.23 1.04 0.93  CALCIUM  --   --  8.4 8.3* 8.2*  MG  --   --   --   --  1.8  PHOS  --   --   --   --  2.3  INR  --   --  1.16  --   --   < > = values in this interval not displayed.  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 -tele, consider dc  RENAL A: Borderline hyperkalemia resolved, Antidiuretes 1 liter (also a component of post obstructive diuresis), K low mild P:   -foley ou -allow autodiuresis / post obstructive -keep k out of bag IV -k follow up -Slight k supp with output noted  GASTROINTESTINAL A:  No acute issues P:   -advance diet per NSGY  HEMATOLOGIC A:  Thrombocytopenia at baseline, post op wound oozing without brisk hemorrhage P:  -hold ASA -monitor drain output and wound output  INFECTIOUS A:  No acute issues P:   -periop cefazolin per NSGY, consider dc  ENDOCRINE A:  DM2, controlled overall P:   -SSI -glipizide -carb modified diet Goal NICE 140-180 met  TODAY'S SUMMARY: K supp, lytes in am, monitor for continues post obstructive output given foley repeat needs and noted output then WILL Sign off, call if needed  [facility] J.,MD Pulmonary and Critical Care Medicine [facility] Pager: [phone]  05/09/2013, 9:47 AM

## 2013-05-10 LAB — BASIC METABOLIC PANEL
CO2: 27 mEq/L (ref 19–32)
Chloride: 99 mEq/L (ref 96–112)
Creatinine, Ser: 0.84 mg/dL (ref 0.50–1.35)
GFR calc Af Amer: >90 mL/min (ref >90)
Sodium: 137 mEq/L (ref 135–145)

## 2013-05-10 LAB — GLUCOSE, CAPILLARY
Glucose-Capillary: 89 mg/dL (ref 70–99)
Glucose-Capillary: 139 mg/dL — ABNORMAL HIGH (ref 70–99)

## 2013-05-10 LAB — MAGNESIUM: Magnesium: 2 mg/dL (ref 1.5–2.5)

## 2013-05-10 LAB — PHOSPHORUS: Phosphorus: 2.7 mg/dL (ref 2.3–4.6)

## 2013-05-10 MED ORDER — DIAZEPAM 5 MG PO TABS
5.0000 mg | ORAL_TABLET | Freq: Four times a day (QID) | ORAL | Status: DC | PRN
  Administered 2013-05-10 – 2013-05-13 (×5): 5 mg via ORAL
  Filled 2013-05-10 (×6): qty 1

## 2013-05-10 MED ORDER — HYDROMORPHONE HCL 2 MG PO TABS
2.0000 mg | ORAL_TABLET | ORAL | Status: DC | PRN
  Administered 2013-05-10 – 2013-05-14 (×9): 2 mg via ORAL
  Filled 2013-05-10 (×10): qty 1

## 2013-05-10 NOTE — Progress Notes (Signed)
Occupational Therapy Treatment Patient Details Name: [name] MRN: [id number] DOB: 02-21-1935 Today's Date: 05/10/2013 Time: 4098-1191 OT Time Calculation (min): 23 min  OT Assessment / Plan / Recommendation Comments on Treatment Session Pt requiring significantly increased assist (+2 total) today for all mobillity.  Pt also with c/o increased pain.  Due to need for increased physical assist, updating d/c plan to SNF.      Follow Up Recommendations  SNF;Supervision/Assistance - 24 hour    Barriers to Discharge       Equipment Recommendations  Tub/shower bench    Recommendations for Other Services    Frequency Min 2X/week   Plan Discharge plan needs to be updated    Precautions / Restrictions Precautions Precautions: Back Precaution Comments: Pt unable to recall any back precautions. Reviewed all 3 back precautions. Required Braces or Orthoses: Spinal Brace Spinal Brace: Applied in sitting position;Lumbar corset   Pertinent Vitals/Pain See vitals    ADL  Grooming: Performed;Wash/dry face;Set up Where Assessed - Grooming: Supported sitting Upper Body Dressing: Performed;Maximal assistance Where Assessed - Upper Body Dressing: Supported sitting Lower Body Dressing: Performed;+1 Total assistance (donned socks) Where Assessed - Lower Body Dressing: Supported sitting Toilet Transfer: Simulated;+2 Total assistance Toilet Transfer: Patient Percentage: 50% Toilet Transfer Method: Sit to stand (ambulating) Acupuncturist:  (bed) Equipment Used: Gait belt;Back brace Transfers/Ambulation Related to ADLs: +2 assist for all OOB mobility.  Pt ambulated ~10 ft with RW with +2 assist and RN providing chair follow.  Pt with slow shuffled steps and c/o pain.  ADL Comments: Pt limited by pain throughout session.     OT Diagnosis:    OT Problem List:   OT Treatment Interventions:     OT Goals ADL Goals Pt Will Transfer to Toilet: with modified independence;Ambulation;with  DME;Comfort height toilet;Maintaining back safety precautions ADL Goal: Toilet Transfer - Progress: Not progressing (due to pain today) Miscellaneous OT Goals Miscellaneous OT Goal #1: Pt will perform bed mobility at mod I level as precursor for EOB ADLs. OT Goal: Miscellaneous Goal #1 - Progress: Not progressing (due to pain today)  Visit Information  Last OT Received On: 05/10/13 Assistance Needed: +2 PT/OT Co-Evaluation/Treatment: Yes    Subjective Data      Prior Functioning       Cognition  Cognition Arousal/Alertness: Awake/alert Behavior During Therapy: Flat affect Overall Cognitive Status: Impaired/Different from baseline Area of Impairment: Problem solving Problem Solving: Slow processing;Requires verbal cues General Comments: Pt requiring increased verbal cueing for log roll technique (that he has been using during past therapy sessions).  Pt unable to problem solve how to scoot to EOB and to correct balance (posterior lean).     Mobility  Bed Mobility Bed Mobility: Rolling Left;Left Sidelying to Sit;Sitting - Scoot to [place] of Bed Rolling Left: 2: Max assist;With rail Left Sidelying to Sit: 1: +2 Total assist;HOB flat Left Sidelying to Sit: Patient Percentage: 20% Sitting - Scoot to Edge of Bed: 1: +2 Total assist Sitting - Scoot to Edge of Bed: Patient Percentage: 30% Details for Bed Mobility Assistance: Pt resisting sidelying<>sit transition and therefore requring increased assist. Transfers Transfers: Stand to Sit;Sit to Stand Sit to Stand: 1: +2 Total assist;From bed;With upper extremity assist Sit to Stand: Patient Percentage: 50% Stand to Sit: 1: +2 Total assist;To chair/3-in-1;With armrests;With upper extremity assist Stand to Sit: Patient Percentage: 70% Details for Transfer Assistance: Assist for power up from bed and to provided steadying once standing due to posterior lean.  Multimodal cueing for  hand and feet placement.    Exercises      Balance  Balance Balance Assessed: Yes Static Sitting Balance Static Sitting - Balance Support: Bilateral upper extremity supported;Feet supported Static Sitting - Level of Assistance: 4: Min assist Static Sitting - Comment/# of Minutes: Pt with posterior lean while sitting EOB.  Pt unable to correct with VCs from therapist and required assist to lean anteriorly to correct balance. Static Standing Balance Static Standing - Balance Support: Bilateral upper extremity supported;During functional activity Static Standing - Level of Assistance: 3: Mod assist Static Standing - Comment/# of Minutes: Assist for balance due to posterior lean while standing with RW.   End of Session OT - End of Session Equipment Utilized During Treatment: Gait belt;Back brace Activity Tolerance: Patient limited by fatigue;Patient limited by pain Patient left: in chair;with call bell/phone within reach;with nursing in room Nurse Communication: Mobility status;Patient requests pain meds  GO   05/10/2013 Cipriano Mile OTR/L Pager [phone] Office [phone]   Cipriano Mile 05/10/2013, 9:21 AM

## 2013-05-10 NOTE — Progress Notes (Signed)
Patient ID: [name], male   DOB: 02-24-1935, 78 y.o.   MRN: [id number] BP 149/64  Pulse 93  Temp(Src) 98.3 F (36.8 C) (Oral)  Resp 19  Ht 5\' 10"  (1.778 m)  Wt 98.6 kg (217 lb 6 oz)  BMI 31.19 kg/m2  SpO2 97% Alert and oriented x 4, moaning fairly frequently stating he is in so much pain Moving lower extremities well Wound is dry Will give valium. Will not remove drain yet, 180cc overnight in drainage Stable exam

## 2013-05-10 NOTE — Progress Notes (Signed)
Physical Therapy Treatment Patient Details Name: [name] MRN: [id number] DOB: [birth year] Today's Date: 05/10/2013 Time: 4098-1191 PT Time Calculation (min): 24 min  PT Assessment / Plan / Recommendation Comments on Treatment Session  Pt not moving well today.  required increased (A) for all mobility.  This therapist recommends ST-SNF at d/c to maximize functional recovery to ensure safe d/c home with wife.  d/c plans updated.      Follow Up Recommendations  SNF     Does the patient have the potential to tolerate intense rehabilitation     Barriers to Discharge        Equipment Recommendations       Recommendations for Other Services    Frequency Min 5X/week   Plan Discharge plan needs to be updated;Frequency remains appropriate    Precautions / Restrictions Precautions Precautions: Back Precaution Comments: Pt unable to recall any back precautions. Reviewed all 3 back precautions. Required Braces or Orthoses: Spinal Brace Spinal Brace: Applied in sitting position;Lumbar corset Restrictions Weight Bearing Restrictions: No   Pertinent Vitals/Pain C/o severe back pain. Did not rate but frequently moaning 2/2 to the pain.      Mobility  Bed Mobility Bed Mobility: Rolling Left;Left Sidelying to Sit;Sitting - Scoot to [place] of Bed Rolling Left: 2: Max assist;With rail Left Sidelying to Sit: 1: +2 Total assist;HOB flat Left Sidelying to Sit: Patient Percentage: 20% Sitting - Scoot to Edge of Bed: 1: +2 Total assist Sitting - Scoot to Edge of Bed: Patient Percentage: 30% Details for Bed Mobility Assistance: Max directional cues.  Resisting sidelying>sit.   Transfers Transfers: Sit to Stand;Stand to Sit Sit to Stand: 1: +2 Total assist;With upper extremity assist;From bed Sit to Stand: Patient Percentage: 50% Stand to Sit: 1: +2 Total assist;To chair/3-in-1;With armrests;With upper extremity assist Stand to Sit: Patient Percentage: 70% Details for Transfer Assistance:  Assist for power up from bed and to provided steadying once standing due to posterior lean.  Multimodal cueing for hand and feet placement. Ambulation/Gait Ambulation/Gait Assistance: 1: +2 Total assist Ambulation/Gait: Patient Percentage: 70% Ambulation Distance (Feet): 10 Feet Assistive device: Rolling walker Ambulation/Gait Assistance Details: (A) for balance & safety due to LE weakness.  .  Cues for positoining inside RW, posture, & safety.   Gait Pattern: Decreased step length - right;Decreased step length - left;Trunk flexed;Shuffle      PT Goals Acute Rehab PT Goals Time For Goal Achievement: 05/16/13 Potential to Achieve Goals: Good Pt will Roll Supine to Left Side: with modified independence PT Goal: Rolling Supine to Left Side - Progress: Not met Pt will go Supine/Side to Sit: with modified independence PT Goal: Supine/Side to Sit - Progress: Not met Pt will go Sit to Supine/Side: with modified independence Pt will go Sit to Stand: with modified independence PT Goal: Sit to Stand - Progress: Not met Pt will Ambulate: >150 feet;with rolling walker;with supervision PT Goal: Ambulate - Progress: Not met Pt will Go Up / Down Stairs: with rail(s);with supervision;3-5 stairs  Visit Information  Last PT Received On: 05/10/13 Assistance Needed: +2    Subjective Data      Cognition  Cognition Arousal/Alertness: Awake/alert Behavior During Therapy: Flat affect Overall Cognitive Status: Impaired/Different from baseline Area of Impairment: Problem solving Problem Solving: Slow processing;Requires verbal cues General Comments: Pt requiring increased verbal cueing for log roll technique (that he has been using during past therapy sessions).  Pt unable to problem solve how to scoot to EOB and to correct balance (posterior lean).  Balance  Balance Balance Assessed: Yes Static Sitting Balance Static Sitting - Balance Support: Bilateral upper extremity supported;Feet  supported Static Sitting - Level of Assistance: 4: Min assist Static Sitting - Comment/# of Minutes: Pt with posterior lean while sitting EOB.  Pt unable to correct with VC's from therapist & required (A) to lean anteriorly to correct balance.   Static Standing Balance Static Standing - Balance Support: Bilateral upper extremity supported;During functional activity Static Standing - Level of Assistance: 3: Mod assist Static Standing - Comment/# of Minutes: (A) for balance 2/2 posterior lean while standing with RW  End of Session PT - End of Session Equipment Utilized During Treatment: Back brace Activity Tolerance: Patient limited by fatigue;Patient limited by pain Patient left: in chair;with call bell/phone within reach;with nursing in room Nurse Communication: Mobility status     Verdell Face, [place] [phone] 05/10/2013

## 2013-05-11 LAB — GLUCOSE, CAPILLARY
Glucose-Capillary: 116 mg/dL — ABNORMAL HIGH (ref 70–99)
Glucose-Capillary: 123 mg/dL — ABNORMAL HIGH (ref 70–99)

## 2013-05-11 MED ORDER — LORAZEPAM 2 MG/ML IJ SOLN
1.0000 mg | Freq: Four times a day (QID) | INTRAMUSCULAR | Status: DC | PRN
  Administered 2013-05-11: 1 mg via INTRAVENOUS
  Filled 2013-05-11: qty 1

## 2013-05-11 NOTE — Progress Notes (Signed)
Patient ID: [name], male   DOB: 27-Jun-1935, 78 y.o.   MRN: [id number] BP 140/65  Pulse 101  Temp(Src) 98.1 F (36.7 C) (Oral)  Resp 25  Ht 5\' 10"  (1.778 m)  Wt 96.4 kg (212 lb 8.4 oz)  BMI 30.49 kg/m2  SpO2 98% Alert and oriented Doing better with pain today Wound dressing is dry, clean Will transfer today to the floor.

## 2013-05-12 LAB — URINALYSIS, ROUTINE W REFLEX MICROSCOPIC
Bilirubin Urine: NEGATIVE
Glucose, UA: NEGATIVE mg/dL
Ketones, ur: 15 mg/dL — AB
Leukocytes, UA: NEGATIVE
Nitrite: NEGATIVE
Protein, ur: NEGATIVE mg/dL
Specific Gravity, Urine: 1.023 (ref 1.005–1.030)
Urobilinogen, UA: 1 mg/dL (ref 0.0–1.0)
pH: 6.5 (ref 5.0–8.0)

## 2013-05-12 LAB — URINE MICROSCOPIC-ADD ON

## 2013-05-12 LAB — GLUCOSE, CAPILLARY
Glucose-Capillary: 124 mg/dL — ABNORMAL HIGH (ref 70–99)
Glucose-Capillary: 111 mg/dL — ABNORMAL HIGH (ref 70–99)

## 2013-05-12 MED FILL — Heparin Sodium (Porcine) Inj 1000 Unit/ML: INTRAMUSCULAR | Qty: 30 | Status: AC

## 2013-05-12 MED FILL — Sodium Chloride IV Soln 0.9%: INTRAVENOUS | Qty: 3000 | Status: AC

## 2013-05-12 NOTE — Progress Notes (Signed)
CSW received referral for New SNF.  Pt wife agreeable to initiation of SNF search in [facility].  SNF search initiated.   CSW to complete full psychosocial assessment.  Jacklynn Lewis, MSW, LCSWA  Clinical Social Work [phone]

## 2013-05-12 NOTE — Progress Notes (Signed)
Pt is very confused and agitated. Wife states "this is different from yesterday." Pt is very sleepy but is arousable. MD notified. Will continue to monitor Pt. Rema Fendt, RN

## 2013-05-12 NOTE — Progress Notes (Signed)
Patient was very confused, agitated and trying to get out of bed unassisted around 2230. Redirected several times with no compliance. Dr. Jeral Fruit on call notified and ordered Ativan 1mg  prn.  Order carried out.

## 2013-05-12 NOTE — Progress Notes (Signed)
Patient ID: [name], male   DOB: 1935/11/03, 78 y.o.   MRN: [id number] Subjective:  The patient is alert and pleasant. He is mildly confused. We are awaiting skilled nursing facility placement.  Objective: Vital signs in last 24 hours: Temp:  [97.7 F (36.5 C)-98.6 F (37 C)] 98.1 F (36.7 C) (06/09 1632) Pulse Rate:  [94-102] 102 (06/09 1632) Resp:  [18-20] 18 (06/09 1632) BP: (135-152)/(54-73) 140/62 mmHg (06/09 1632) SpO2:  [99 %-100 %] 100 % (06/09 1632)  Intake/Output from previous day: 06/08 0701 - 06/09 0700 In: 123 [P.O.:120; I.V.:3] Out: 1705 [Urine:1705] Intake/Output this shift: Total I/O In: 120 [P.O.:120] Out: 1225 [Urine:1225]  Physical exam the patient is alert and pleasant. He is moving his lower extremities well.  Lab Results: No results found for this basename: WBC, HGB, HCT, PLT,  in the last 72 hours BMET  Recent Labs  05/10/13 0540  NA 137  K 3.6  CL 99  CO2 27  GLUCOSE 98  BUN 11  CREATININE 0.84  CALCIUM 8.6    Studies/Results: No results found.  Assessment/Plan: Postop day #5: The patient is a little confused The patient's urinalysis looks okay. His sodium was okay 2 days ago. We'll await cultures. We are awaiting he'll nursing facility placement.  LOS: 5 days     Shelise Maron D 05/12/2013, 5:34 PM

## 2013-05-12 NOTE — Progress Notes (Signed)
Occupational Therapy Treatment Patient Details Name: [name] MRN: [id number] DOB: 12/31/1934 Today's Date: 05/12/2013 Time: 0272-5366 OT Time Calculation (min): 18 min  OT Assessment / Plan / Recommendation Comments on Treatment Session   78 year old gentleman who undergone a previous L4-S1 fusion with back brace. OT to follow acutely. Pt with cognitive deficits and unsafe to d/c home at this time.     Follow Up Recommendations  SNF;Supervision/Assistance - 24 hour    Barriers to Discharge       Equipment Recommendations       Recommendations for Other Services    Frequency Min 2X/week   Plan Discharge plan remains appropriate    Precautions / Restrictions Precautions Precautions: Back Precaution Comments: agitiated not answering questions regarding back precautions Required Braces or Orthoses: Spinal Brace Spinal Brace: Applied in sitting position;Lumbar corset   Pertinent Vitals/Pain No c/o pain    ADL  Grooming: +1 Total assistance Where Assessed - Grooming: Supine, head of bed up (total (A) by wife- pt allowed wife (A)) ADL Comments: pt supine on arrival and aroused with name call/ tactile input. Pt moving hands toward therapist and aroused unpleasantly. Pt educated on OT and reason for arrival. Pt agititated and requesting therapist to leave. Wife attempting to calm patient and pt allowing cool wash cloth on face. OT positioned bil LE off side of bed and pad used to turn hips to the right. Pt opening eyes and reaching for bed rail Pt then placing bil LE back into bed using bed rail. pt declined EOB sitting. Pt threatening therapist and telling therapist to leave. Wife present and very concerned. RN Morrie Sheldon made aware of therapist inability to participate. RN contacting MD. Session very limited. Pt with no recall of back precautions, and lack of awarness to surgery. pt uncooperative with questioning and giving short closed answers. COgnition is impaired    OT Diagnosis:     OT Problem List:   OT Treatment Interventions:     OT Goals Acute Rehab OT Goals OT Goal Formulation: With patient Time For Goal Achievement: 05/15/13 Potential to Achieve Goals: Good ADL Goals Pt Will Perform Grooming: with modified independence;Standing at sink ADL Goal: Grooming - Progress: Not progressing Pt Will Perform Lower Body Bathing: with modified independence;Sit to stand from chair;Sit to stand from bed;with adaptive equipment ADL Goal: Lower Body Bathing - Progress: Not progressing Pt Will Perform Lower Body Dressing: with modified independence;Sit to stand from chair;Sit to stand from bed;with adaptive equipment ADL Goal: Lower Body Dressing - Progress: Not progressing Pt Will Transfer to Toilet: with modified independence;Ambulation;with DME;Comfort height toilet;Maintaining back safety precautions ADL Goal: Toilet Transfer - Progress: Not progressing Pt Will Perform Toileting - Clothing Manipulation: with modified independence;Standing ADL Goal: Toileting - Clothing Manipulation - Progress: Not progressing Pt Will Perform Toileting - Hygiene: with modified independence;Sit to stand from 3-in-1/toilet ADL Goal: Toileting - Hygiene - Progress: Not progressing Miscellaneous OT Goals Miscellaneous OT Goal #1: Pt will perform bed mobility at mod I level as precursor for EOB ADLs. OT Goal: Miscellaneous Goal #1 - Progress: Not progressing  Visit Information  Last OT Received On: 05/12/13 Assistance Needed: +2    Subjective Data      Prior Functioning       Cognition  Cognition Arousal/Alertness: Lethargic Behavior During Therapy: Agitated Overall Cognitive Status: Impaired/Different from baseline Area of Impairment: Attention;Memory;Following commands Current Attention Level: Focused Memory: Decreased short-term memory Following Commands: Follows one step commands inconsistently Problem Solving: Slow processing;Decreased initiation General Comments:  pt with  no recall of surgery    Mobility  Bed Mobility Rolling Right: 1: +2 Total assist Rolling Right: Patient Percentage: 40% Right Sidelying to Sit: 1: +2 Total assist Right Sidelying to Sit: Patient Percentage: 30% Sit to Sidelying Right: 1: +2 Total assist Sit to Sidelying Right: Patient Percentage: 60% Details for Bed Mobility Assistance: see above ADL session Transfers Transfers: Not assessed    Exercises      Balance     End of Session    GO     Lucile Shutters 05/12/2013, 1:51 PM Pager: [phone]

## 2013-05-12 NOTE — Progress Notes (Signed)
Physical Therapy Treatment Patient Details Name: [name] MRN: [id number] DOB: 1935/05/27 Today's Date: 05/12/2013 Time: 7829-5621 PT Time Calculation (min): 11 min  PT Assessment / Plan / Recommendation Comments on Treatment Session  Patient continues to be limited with mobility. Today due to cognition. Patient given ativan earlier. Spoke with wife reguarding his immobility and answered questions reguarding SNF and rehab    Follow Up Recommendations  SNF     Does the patient have the potential to tolerate intense rehabilitation     Barriers to Discharge        Equipment Recommendations  None recommended by PT    Recommendations for Other Services    Frequency Min 5X/week   Plan Frequency remains appropriate;Discharge plan remains appropriate    Precautions / Restrictions Precautions Precautions: Back Precaution Comments: Pt unable to recall any back precautions. Reviewed all 3 back precautions. Required Braces or Orthoses: Spinal Brace Spinal Brace: Applied in sitting position;Lumbar corset Restrictions Weight Bearing Restrictions: No   Pertinent Vitals/Pain Patient confused and agiaitated    Mobility  Bed Mobility Rolling Right: 1: +2 Total assist Rolling Right: Patient Percentage: 40% Right Sidelying to Sit: 1: +2 Total assist Right Sidelying to Sit: Patient Percentage: 30% Sit to Sidelying Right: 1: +2 Total assist Sit to Sidelying Right: Patient Percentage: 60% Details for Bed Mobility Assistance: Max directional cues.  Resisting sidelying>sit.  Patient threw himself back into bed once bought up into sitting and starting cussing Transfers Transfers: Not assessed Ambulation/Gait Ambulation/Gait Assistance: Not tested (comment)    Exercises     PT Diagnosis:    PT Problem List:   PT Treatment Interventions:     PT Goals Acute Rehab PT Goals PT Goal: Rolling Supine to Left Side - Progress: Not progressing PT Goal: Supine/Side to Sit - Progress: Not  progressing PT Goal: Sit to Supine/Side - Progress: Not progressing  Visit Information  Last PT Received On: 05/12/13 Assistance Needed: +2    Subjective Data      Cognition  Cognition Arousal/Alertness: Lethargic Behavior During Therapy: Agitated Overall Cognitive Status: Impaired/Different from baseline Area of Impairment: Awareness Problem Solving: Slow processing;Requires verbal cues General Comments: Pt requiring increased verbal cueing for log roll technique (that he has been using during past therapy sessions).     Balance     End of Session PT - End of Session Activity Tolerance: Treatment limited secondary to agitation Patient left: in bed;with call bell/phone within reach;with nursing in room   GP     Fredrich Birks 05/12/2013, 11:32 AM 05/12/2013 Fredrich Birks PTA [phone] pager [phone] office

## 2013-05-13 LAB — URINE CULTURE
Colony Count: NO GROWTH
Culture: NO GROWTH

## 2013-05-13 LAB — GLUCOSE, CAPILLARY: Glucose-Capillary: 118 mg/dL — ABNORMAL HIGH (ref 70–99)

## 2013-05-13 NOTE — Progress Notes (Addendum)
Clinical Social Work Department BRIEF PSYCHOSOCIAL ASSESSMENT 05/13/2013  Patient:  [name], [name]     Account Number:  [id number]     Admit date:  05/07/2013  Clinical Social Worker:  Jacelyn Grip  Date/Time:  05/12/2013 04:00 PM  Referred by:  Physician  Date Referred:  05/12/2013 Referred for  SNF Placement   Other Referral:   Interview type:  Family Other interview type:    PSYCHOSOCIAL DATA Living Status:  WIFE Admitted from facility:   Level of care:   Primary support name:  Joevon Holliman Primary support relationship to patient:  SPOUSE Degree of support available:   strong    CURRENT CONCERNS Current Concerns  Post-Acute Placement   Other Concerns:    SOCIAL WORK ASSESSMENT / PLAN CSW received referral for New SNF.    CSW met with pt wife at bedside. Pt daughter and pt granddaughter also present. CSW discussed recommendation for SNF at d/c. Pt wife agreeable to SNF search in [facility]. Preferences are [facility] or [facility] and [address].    CSW completed FL2 and intiated SNF search to [facility]. CSW contacted [facility]  and [facility] to notify of pt family interest in facility.    CSW to follow up with bed offers.    CSW to facilitate pt discharge needs when pt medically stable for discharge.   Assessment/plan status:  Psychosocial Support/Ongoing Assessment of Needs Other assessment/ plan:   discharge planning   Information/referral to community resources:   [facility] list    PATIENT'S/FAMILY'S RESPONSE TO PLAN OF CARE: Pt oriented to person only and unable to engage in assessment. Pt family appears supportive and actively involved in pt care and hopeful for placement close to pt family in [place].     Jacklynn Lewis, MSW, LCSWA  Clinical Social Work Coverage [phone]

## 2013-05-13 NOTE — Progress Notes (Signed)
Patient ID: [name], male   DOB: [birth year], 78 y.o.   MRN: [id number] Subjective:  The patient is alert and pleasant. He remains mildly confused.  Objective: Vital signs in last 24 hours: Temp:  [98.1 F (36.7 C)-98.6 F (37 C)] 98.5 F (36.9 C) (06/10 0631) Pulse Rate:  [95-102] 95 (06/10 0631) Resp:  [18-22] 20 (06/10 0631) BP: (136-152)/(54-65) 136/54 mmHg (06/10 0631) SpO2:  [96 %-100 %] 98 % (06/10 0631)  Intake/Output from previous day: 06/09 0701 - 06/10 0700 In: 303 [P.O.:300; I.V.:3] Out: 1225 [Urine:1225] Intake/Output this shift:    Physical exam the patient is alert and oriented x2, Glasgow Coma Scale 14. His strength is grossly normal his lower extremities. His dressing is clean and dry.  Lab Results: No results found for this basename: WBC, HGB, HCT, PLT,  in the last 72 hours BMET No results found for this basename: NA, K, CL, CO2, GLUCOSE, BUN, CREATININE, CALCIUM,  in the last 72 hours  Studies/Results: No results found.  Assessment/Plan: Postop day #6: We are awaiting skilled nursing facility placement.  Urinary retention: We will try to discontinue the catheter again tomorrow.  LOS: 6 days     Mathew Storck D 05/13/2013, 8:10 AM

## 2013-05-13 NOTE — Progress Notes (Signed)
SNF bed offered and accepted at [facility] in [place]-  Per RN, MD says plan dc tomorrow- will proceed with SNF for insurance auth dor tomorrow SNF tx.   Reece Levy, MSW, Theresia Majors [phone]

## 2013-05-13 NOTE — Progress Notes (Signed)
Clinical Social Work Department CLINICAL SOCIAL WORK PLACEMENT NOTE 05/13/2013  Patient:  NIKOLOS, BILLIG  Account Number:  [id number] Admit date:  05/07/2013  Clinical Social Worker:  Jacelyn Grip  Date/time:  05/12/2013 04:15 PM  Clinical Social Work is seeking post-discharge placement for this patient at the following level of care:   SKILLED NURSING   (*CSW will update this form in Epic as items are completed)   05/12/2013  Patient/family provided with [facility] Department of Clinical Social Work's list of facilities offering this level of care within the geographic area requested by the patient (or if unable, by the patient's family).  05/12/2013  Patient/family informed of their freedom to choose among providers that offer the needed level of care, that participate in Medicare, Medicaid or managed care program needed by the patient, have an available bed and are willing to accept the patient.  05/12/2013  Patient/family informed of [facility]' ownership interest in [facility], as well as of the fact that they are under no obligation to receive care at this facility.  PASARR submitted to EDS on 05/13/2013 PASARR number received from EDS on 05/13/2013  FL2 transmitted to all facilities in geographic area requested by pt/family on  05/12/2013 FL2 transmitted to all facilities within larger geographic area on   Patient informed that his/her managed care company has contracts with or will negotiate with  certain facilities, including the following:     Patient/family informed of bed offers received:   Patient chooses bed at  Physician recommends and patient chooses bed at    Patient to be transferred to  on   Patient to be transferred to facility by   The following physician request were entered in Epic:   Additional Comments:   Jacklynn Lewis, MSW, [facility]  Clinical Social Work Coverage [phone]

## 2013-05-13 NOTE — Progress Notes (Signed)
Physical Therapy Treatment Patient Details Name: [name] MRN: [id number] DOB: [birth year] Today's Date: 05/13/2013 Time: 4696-2952 PT Time Calculation (min): 27 min  PT Assessment / Plan / Recommendation Comments on Treatment Session  Able to ambulate further today but with significant complaints of pain throughout transitional movements. Agree with SNF rehab. Educated patient and wife on importance of mobility with nsg staff especially trying to get OOB and sit up for meals. Slow progress with goals, they have been updated accordingly.     Follow Up Recommendations  SNF     Does the patient have the potential to tolerate intense rehabilitation     Barriers to Discharge        Equipment Recommendations  None recommended by PT    Recommendations for Other Services    Frequency Min 5X/week   Plan Frequency remains appropriate;Discharge plan remains appropriate    Precautions / Restrictions Precautions Precautions: Back Precaution Comments: able to repeat 3/3 back precautions with 1 minute recall but unable to recite after that Required Braces or Orthoses: Spinal Brace Spinal Brace: Lumbar corset Restrictions Weight Bearing Restrictions: No   Pertinent Vitals/Pain Reports 10/10 back pain, RN made aware, doesn't appear he is getting much pain control with meds    Mobility  Bed Mobility Bed Mobility: Rolling Left;Left Sidelying to Sit;Sitting - Scoot to Edge of Bed Rolling Left: 3: Mod assist;With rail Left Sidelying to Sit: 2: Max assist;HOB elevated (30 degrees) Details for Bed Mobility Assistance: sequencing cues throughout with heavy facilitation for follow through Transfers Transfers: Sit to Stand;Stand to Sit Sit to Stand: 1: +2 Total assist;With upper extremity assist;With armrests;From bed;From chair/3-in-1 Stand to Sit: 1: +2 Total assist;With upper extremity assist;With armrests;To chair/3-in-1 Details for Transfer Assistance: from taller bed patient able to  stand with +2totalpt60%, from lower surface patient at 50%; needs mod v/c's for safe sequencing and hand placement, facilitation bilaterally for anterior translation of trunk over BOS and upward lift to stabilize as he reaches for the RW Ambulation/Gait Ambulation/Gait Assistance: 4: Min assist;3: Mod assist Ambulation Distance (Feet): 200 Feet (100 ft x2 (seated rest in between)) Assistive device: Rolling walker Ambulation/Gait Assistance Details: cues for positioning within RW and tall posture Gait Pattern: Step-through pattern;Trunk flexed;Shuffle Gait velocity: less than 1.8 ft/sec indicating risk for recurrent falls.       PT Goals Acute Rehab PT Goals Pt will Roll Supine to Left Side: with min assist PT Goal: Rolling Supine to Left Side - Progress: Revised due to lack of progress Pt will go Supine/Side to Sit: with min assist PT Goal: Supine/Side to Sit - Progress: Revised due to lack of progress Pt will go Sit to Supine/Side: with min assist PT Goal: Sit to Supine/Side - Progress: Revised due to lack of progress Pt will go Sit to Stand: with supervision PT Goal: Sit to Stand - Progress: Revised due to lack of progress Pt will Ambulate: >150 feet;with rolling walker;with supervision PT Goal: Ambulate - Progress: Progressing toward goal PT Goal: Up/Down Stairs - Progress: Discontinued (comment) (not relevant at this time) Additional Goals Additional Goal #1: Patient will verbalize back precautions independently. PT Goal: Additional Goal #1 - Progress: Progressing toward goal  Visit Information  Last PT Received On: 05/13/13 Assistance Needed: +2    Subjective Data  Subjective: After that last surgery I was up walking. (pt very emotional today)   Cognition  Cognition Arousal/Alertness: Lethargic Behavior During Therapy:  (emotional) Overall Cognitive Status: Within Functional Limits for tasks assessed Area of  Impairment: Orientation Orientation Level: Time Problem  Solving: Slow processing;Difficulty sequencing    Balance  Static Sitting Balance Static Sitting - Balance Support: Bilateral upper extremity supported;Feet supported Static Sitting - Level of Assistance: 4: Min assist  End of Session PT - End of Session Equipment Utilized During Treatment: Back brace Activity Tolerance: Patient tolerated treatment well;Patient limited by pain Patient left: with call bell/phone within reach;with nursing in room;in chair Nurse Communication: Mobility status;Patient requests pain meds   GP     [facility] HELEN 05/13/2013, 2:19 PM

## 2013-05-14 LAB — GLUCOSE, CAPILLARY
Glucose-Capillary: 97 mg/dL (ref 70–99)
Glucose-Capillary: 127 mg/dL — ABNORMAL HIGH (ref 70–99)

## 2013-05-14 MED ORDER — OXYCODONE-ACETAMINOPHEN 5-325 MG PO TABS: 1.0000 | ORAL_TABLET | ORAL | Status: AC | PRN

## 2013-05-14 MED ORDER — DSS 100 MG PO CAPS: 100.0000 mg | ORAL_CAPSULE | Freq: Two times a day (BID) | ORAL | Status: AC

## 2013-05-14 MED ORDER — CYCLOBENZAPRINE HCL 10 MG PO TABS: 10.0000 mg | ORAL_TABLET | Freq: Three times a day (TID) | ORAL | Status: AC | PRN

## 2013-05-14 NOTE — Progress Notes (Signed)
Clinical Social Work Department CLINICAL SOCIAL WORK PLACEMENT NOTE 05/14/2013  Patient:  [name], [name]  Account Number:  [id number] Admit date:  05/07/2013  Clinical Social Worker:  Jacelyn Grip  Date/time:  05/12/2013 04:15 PM  Clinical Social Work is seeking post-discharge placement for this patient at the following level of care:   SKILLED NURSING   (*CSW will update this form in Epic as items are completed)   05/12/2013  Patient/family provided with [facility] Department of Clinical Social Work's list of facilities offering this level of care within the geographic area requested by the patient (or if unable, by the patient's family).  05/12/2013  Patient/family informed of their freedom to choose among providers that offer the needed level of care, that participate in Medicare, Medicaid or managed care program needed by the patient, have an available bed and are willing to accept the patient.  05/12/2013  Patient/family informed of [facility]' ownership interest in [facility], as well as of the fact that they are under no obligation to receive care at this facility.  PASARR submitted to EDS on 05/13/2013 PASARR number received from EDS on 05/13/2013  FL2 transmitted to all facilities in geographic area requested by pt/family on  05/12/2013 FL2 transmitted to all facilities within larger geographic area on   Patient informed that his/her managed care company has contracts with or will negotiate with  certain facilities, including the following:     Patient/family informed of bed offers received:  05/13/2013 Patient chooses bed at [facility] Physician recommends and patient chooses bed at    Patient to be transferred to [facility] on  05/14/2013 Patient to be transferred to facility by ems  The following physician request were entered in Epic:   Additional Comments: Reece Levy, MSW, [facility] Majors [phone]

## 2013-05-14 NOTE — Discharge Summary (Signed)
Physician Discharge Summary  Patient ID: [name] MRN: [id number] DOB/AGE: 1935-06-08 78 y.o.  Admit date: 05/07/2013 Discharge date: 05/14/2013  Admission Diagnoses:L2-3 and L3-4 disc degeneration, spinal stenosis, lumbago, lumbar radiculopathy  Discharge Diagnoses: the same Principal Problem:   Spinal stenosis of lumbar region Active Problems:   CAD (coronary artery disease)   DM2 (diabetes mellitus, type 2)   HTN (hypertension)   Discharged Condition: fair  Hospital Course: Dr. Wynetta Emery admitted the patient on 05/07/2013. On that day to perform an exploration of lumbar fusion with an L2-3, L3-4 and L5-S1 fusion, instrumentation. The surgery went well.  The patient's postoperative course was remarkable only for some persistent pain and some confusion. The patient's sodium was normal. His urine cultures were negative. His confusion resolved.  The patient elected to go to a skilled nursing facility and [place]. Arrangements were made and he was transferred there on 05/14/2013. The patient was given oral and written discharge instructions. All his questions were answered.  Consults:PT OT Significant Diagnostic Studies:none Treatments:exploration of lumbar fusion, L2-3, L3-4 and L5-S1 decompression, instrumentation and fusion. Discharge Exam: Blood pressure 107/60, pulse 89, temperature 98.2 F (36.8 C), temperature source Oral, resp. rate 18, height 5\' 10"  (1.778 m), weight 96.4 kg (212 lb 8.4 oz), SpO2 98.00%. The patient is alert and pleasant. His wound is healing well without signs of infection. His lower extremity strength is grossly normal.  Disposition: [facility] nursing facility in [place]  Discharge Orders   Future Orders Complete By Expires     Call MD for:  difficulty breathing, headache or visual disturbances  As directed     Call MD for:  extreme fatigue  As directed     Call MD for:  hives  As directed     Call MD for:  persistant dizziness or  light-headedness  As directed     Call MD for:  persistant nausea and vomiting  As directed     Call MD for:  redness, tenderness, or signs of infection (pain, swelling, redness, odor or green/yellow discharge around incision site)  As directed     Call MD for:  severe uncontrolled pain  As directed     Call MD for:  temperature >100.4  As directed     Diet - low sodium heart healthy  As directed     Discharge instructions  As directed     Comments:      Call [phone] for a followup appointment. Take a stool softener while you are using pain medications.    Driving Restrictions  As directed     Comments:      Do not drive for 2 weeks.    Increase activity slowly  As directed     Lifting restrictions  As directed     Comments:      Do not lift more than 5 pounds. No excessive bending or twisting.    May shower / Bathe  As directed     Comments:      He may shower after the pain she is removed 3 days after surgery. Leave the incision alone.    No dressing needed  As directed         Medication List    STOP taking these medications       traMADol 50 MG tablet  Commonly known as:  ULTRAM      TAKE these medications       amLODipine 10 MG tablet  Commonly known as:  NORVASC  Take 10 mg by mouth daily.     aspirin 325 MG tablet  Take 325 mg by mouth daily.     cyclobenzaprine 10 MG tablet  Commonly known as:  FLEXERIL  Take 1 tablet (10 mg total) by mouth 3 (three) times daily as needed for muscle spasms.     DSS 100 MG Caps  Take 100 mg by mouth 2 (two) times daily.     fish oil-omega-3 fatty acids 1000 MG capsule  Take 2 g by mouth 2 (two) times daily.     Garlic 1000 MG Caps  Take 2 capsules by mouth 2 (two) times daily.     glipiZIDE 2.5 MG 24 hr tablet  Commonly known as:  GLUCOTROL XL  Take 2.5 mg by mouth daily.     nitroGLYCERIN 0.4 MG SL tablet  Commonly known as:  NITROSTAT  Place 0.4 mg under the tongue every 5 (five) minutes as needed.      oxyCODONE-acetaminophen 5-325 MG per tablet  Commonly known as:  PERCOCET/ROXICET  Take 1-2 tablets by mouth every 4 (four) hours as needed.         SignedCristi Loron 05/14/2013, 8:56 AM

## 2013-05-14 NOTE — Care Management Note (Signed)
    Page 1 of 1   05/14/2013     3:51:12 PM   CARE MANAGEMENT NOTE 05/14/2013  Patient:  [name], [name]   Account Number:  [id number]  Date Initiated:  05/14/2013  Documentation initiated by:  Elmer Bales  Subjective/Objective Assessment:   Pt was admitted for  L2-4 PLIF     Action/Plan:   Will follow for discharge planning pending PT/OT evals   Anticipated DC Date:  05/14/2013   Anticipated DC Plan:  SKILLED NURSING FACILITY  In-house referral  Clinical Social Worker      DC Planning Services  CM consult      Choice offered to / List presented to:             Status of service:  Completed, signed off Medicare Important Message given?   (If response is "NO", the following Medicare IM given date fields will be blank) Date Medicare IM given:   Date Additional Medicare IM given:    Discharge Disposition:  SKILLED NURSING FACILITY  Per UR Regulation:  Reviewed for med. necessity/level of care/duration of stay  If discussed at Long Length of Stay Meetings, dates discussed:    Comments:

## 2013-05-14 NOTE — Progress Notes (Signed)
Pt discharge to [facility] in [place].

## 2013-05-14 NOTE — Progress Notes (Signed)
Physical Therapy Treatment Patient Details Name: [name] MRN: [id number] DOB: Aug 01, 1935 Today's Date: 05/14/2013 Time: 1324-4010 PT Time Calculation (min): 28 min  PT Assessment / Plan / Recommendation Comments on Treatment Session  Ambulating about the same today with significant pain especially with transitional movements. Patient needs to be moving more throughout the day. Encouraged him and his wife for him to sit up for meals.     Follow Up Recommendations  SNF     Does the patient have the potential to tolerate intense rehabilitation     Barriers to Discharge        Equipment Recommendations  None recommended by PT    Recommendations for Other Services    Frequency Min 5X/week   Plan Frequency remains appropriate;Discharge plan remains appropriate    Precautions / Restrictions Precautions Precautions: Back Precaution Comments: able to repeat 3/3 back precautions with 1 minute recall but unable to recite after that Required Braces or Orthoses: Spinal Brace Spinal Brace: Lumbar corset;Applied in sitting position   Pertinent Vitals/Pain Reports significant pain, using faces scale he is a 10, he did get emotional today; encouraged moving several times throughout the day to change positions, Rn provided pain meds prior to our session    Mobility  Bed Mobility Bed Mobility: Not assessed Details for Bed Mobility Assistance: pt presents sitting EOB with nsg Transfers Transfers: Sit to Stand;Stand to Sit Sit to Stand: 1: +2 Total assist;With upper extremity assist;With armrests;From bed;From chair/3-in-1 Sit to Stand: Patient Percentage: 60% Stand to Sit: 1: +2 Total assist;With upper extremity assist;With armrests;To chair/3-in-1 Details for Transfer Assistance: again pt abel to stand better from elevated bed (performing 60-65%) but from lower recliner needs modA bilaterally to lift off and power up; mod v/c's throughout for sequencing and hand placement; modA to  control descent to chair Ambulation/Gait Ambulation/Gait Assistance: 4: Min assist Ambulation Distance (Feet): 175 Feet (seated rest in the middle) Assistive device: Rolling walker Ambulation/Gait Assistance Details: min tactile cues for positioning and tall posture, lowered RW to allow his arms to straighten more as he was complaining of soreness and fatigue in his arms as he is relying on them very heavily to hold his back erect Gait Pattern: Step-through pattern;Decreased stride length;Shuffle;Trunk flexed      PT Goals Acute Rehab PT Goals PT Goal: Sit to Stand - Progress: Progressing toward goal PT Goal: Ambulate - Progress: Progressing toward goal Additional Goals PT Goal: Additional Goal #1 - Progress: Progressing toward goal  Visit Information  Last PT Received On: 05/14/13 Assistance Needed: +2    Subjective Data  Subjective: I haven't been out of bed yet all day. (pt thinks that it is late in the evening)   Cognition  Cognition Arousal/Alertness: Awake/alert Behavior During Therapy: Anxious (emotional) Overall Cognitive Status: Within Functional Limits for tasks assessed Area of Impairment: Orientation Orientation Level: Time (thought it was the evening) Current Attention Level: Selective Memory: Decreased short-term memory Following Commands: Follows one step commands consistently;Follows one step commands with increased time Problem Solving: Slow processing;Difficulty sequencing General Comments: doesn't recall the past few days, thinks he has just been left in the bed    Balance     End of Session PT - End of Session Equipment Utilized During Treatment: Gait belt;Back brace Activity Tolerance: Patient limited by fatigue;Patient limited by pain Patient left: in chair;with call bell/phone within reach Nurse Communication: Mobility status   GP     [facility] HELEN 05/14/2013, 11:26 AM

## 2013-06-02 NOTE — Progress Notes (Signed)
Read note and agree with change in d/c destination. Rollene Rotunda Eliazer Hemphill, PT, DPT [phone]

## 2013-12-16 ENCOUNTER — Other Ambulatory Visit: Payer: Self-pay | Admitting: *Deleted

## 2013-12-16 DIAGNOSIS — I739 Peripheral vascular disease, unspecified: Secondary | ICD-10-CM

## 2014-01-09 ENCOUNTER — Encounter: Payer: Self-pay | Admitting: Vascular Surgery

## 2014-01-12 ENCOUNTER — Ambulatory Visit (HOSPITAL_COMMUNITY)
Admission: RE | Admit: 2014-01-12 | Discharge: 2014-01-12 | Disposition: A | Discharge status: Home / Self Care | Payer: Medicare Other | Source: Ambulatory Visit | Attending: Surgery | Admitting: Surgery

## 2014-01-12 ENCOUNTER — Encounter: Payer: Self-pay | Admitting: Surgery

## 2014-01-12 ENCOUNTER — Ambulatory Visit (INDEPENDENT_AMBULATORY_CARE_PROVIDER_SITE_OTHER): Payer: Medicare Other | Admitting: Surgery

## 2014-01-12 VITALS — BP 145/61 | HR 59 | Ht 70.0 in | Wt 212.6 lb

## 2014-01-12 DIAGNOSIS — I739 Peripheral vascular disease, unspecified: Secondary | ICD-10-CM

## 2014-01-12 DIAGNOSIS — M79609 Pain in unspecified limb: Secondary | ICD-10-CM | POA: Insufficient documentation

## 2014-01-12 DIAGNOSIS — I70219 Atherosclerosis of native arteries of extremities with intermittent claudication, unspecified extremity: Secondary | ICD-10-CM

## 2014-01-12 MED ORDER — CILOSTAZOL 100 MG PO TABS: 100.0000 mg | ORAL_TABLET | Freq: Two times a day (BID) | ORAL | Status: AC

## 2014-01-12 NOTE — Progress Notes (Signed)
Patient name: [name] MRN: [id number] DOB: 05/12/1935 Sex: male   Referred by: Dr. Wynetta Emeryram  Reason for referral:  Chief Complaint  Patient presents with  . New Evaluation    claudication - c/o pain in LE when walking or standing    HISTORY OF PRESENT ILLNESS: This is a very pleasant 79 year old gentleman who was referred today for evaluation of leg pain.  The patient complains of bilateral thigh and calf cramping that is brought on by exercise and alleviated with rest.  He states that his symptoms occur after about 100 feet of ambulation.  His left leg bothers him more so than the right.  He denies nonhealing wounds or pain that keeps him up at night.  His symptoms are extremely lifestyle limiting.  The patient suffers from coronary artery disease.  He had CABG in 1992.  He had a heart attack in 2005 that was treated with stenting.  He suffers from hypercholesterolemia.  He has been intolerant of statins secondary to leg weakness.  He is now taking fish oil.  He quit smoking many years ago.  He is medically managed for hypertension.  He takes an aspirin 3 times a week.  He takes Glucotrol for his diabetes.  He has a CT scan that shows a 3.7 cm abdominal aortic aneurysm.  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  Ectasia of bilateral iliac arteries is also noted, 1.7 on the left and 1.6 on the right  Past Medical History  Diagnosis Date  . CAD (coronary artery disease)     s/p cabg\htn  . HLD (hyperlipidemia)   . Myocardial infarction   . Hypertension   . Diabetes mellitus without complication   . Arthritis   . Anemia     Past Surgical History  Procedure Laterality Date  . Cardiac catheterization  2004  . Coronary artery bypass graft  1992  . Appendectomy    . Back surgery    . Knee surgery      History   Social History  . Marital Status: Married    Spouse Name: N/A    Number of Children: 2  . Years of Education: N/A    Occupational History  . Retired    Social History Main Topics  . Smoking status: Former Smoker -- 40 years    Quit date: 09/06/1990  . Smokeless tobacco: Current User    Types: Chew  . Alcohol Use: No  . Drug Use: No  . Sexual Activity: Not on file   Other Topics Concern  . Not on file   Social History Narrative  . No narrative on file    Family History  Problem Relation Age of Onset  . Heart disease    . Depression Father     Allergies as of 01/12/2014 - Review Complete 01/12/2014  Allergen Reaction Noted  . Statins Other (See Comments) 08/01/2011    Current Outpatient Prescriptions on File Prior to Visit  Medication Sig Dispense Refill  . aspirin 325 MG tablet Take 325 mg by mouth 3 (three) times a week.       . fish oil-omega-3 fatty acids 1000 MG capsule Take 2 g by mouth 2 (two) times daily.        . Garlic 1000 MG CAPS Take 2 capsules by mouth 2 (two) times daily.        Marland Kitchen. glipiZIDE (GLUCOTROL XL) 2.5 MG 24 hr tablet Take 2.5  mg by mouth daily.      Marland Kitchen amLODipine (NORVASC) 10 MG tablet Take 10 mg by mouth daily.      . cyclobenzaprine (FLEXERIL) 10 MG tablet Take 1 tablet (10 mg total) by mouth 3 (three) times daily as needed for muscle spasms.  50 tablet  1  . docusate sodium 100 MG CAPS Take 100 mg by mouth 2 (two) times daily.  60 capsule  0  . nitroGLYCERIN (NITROSTAT) 0.4 MG SL tablet Place 0.4 mg under the tongue every 5 (five) minutes as needed.        Marland Kitchen oxyCODONE-acetaminophen (PERCOCET/ROXICET) 5-325 MG per tablet Take 1-2 tablets by mouth every 4 (four) hours as needed.  100 tablet  0   No current facility-administered medications on file prior to visit.     REVIEW OF SYSTEMS: Please see history of present illness.  In addition the patient complains of leg weakness.  Otherwise all systems are negative  PHYSICAL EXAMINATION: General: The patient appears their stated age.  Vital signs are BP 145/61  Pulse 59  Ht 5\' 10"  (1.778 m)  Wt 212 lb 9.6 oz  (96.435 kg)  BMI 30.51 kg/m2  SpO2 96% HEENT:  No gross abnormalities Pulmonary: Respirations are non-labored Abdomen: Soft and non-tender  Musculoskeletal: There are no major deformities.   Neurologic: No focal weakness or paresthesias are detected, Skin: There are no ulcer or rashes noted. Psychiatric: The patient has normal affect. Cardiovascular: There is a regular rate and rhythm without significant murmur appreciated.  No carotid bruits.  Palpable femoral pulses.  Pedal pulses are not palpable  Diagnostic Studies: Ankle-brachial indices ordered and reviewed today.  They are 0.7 on the right anterior 0.78 on the left with monophasic waveforms   Assessment:  Peripheral vascular disease with claudication, left greater than right Plan: I discussed the natural history of patients with claudication as well as the pathophysiology of peripheral vascular disease.  At this point, I feel the best plan is to start with medical management.  In addition to controlling his diabetes cholesterol and hypertension as well as taking an antiplatelet medication, I have added cilostazol to his medical regimen to see this can improve his walking distance.  We discussed the side effects of the medication and to cut the pill in half if he is getting benefit but suffering mild side effects.  I have scheduled him to come back for reevaluation in 3 months.     Jorge Ny, M.D. [facility] Office: [phone] Pager:  [phone]

## 2014-01-22 ENCOUNTER — Encounter: Payer: Self-pay | Admitting: Neurosurgery

## 2014-04-01 ENCOUNTER — Other Ambulatory Visit: Payer: Self-pay | Admitting: *Deleted

## 2014-04-01 DIAGNOSIS — I714 Abdominal aortic aneurysm, without rupture, unspecified: Secondary | ICD-10-CM

## 2014-04-01 DIAGNOSIS — I7789 Other specified disorders of arteries and arterioles: Secondary | ICD-10-CM

## 2014-04-10 ENCOUNTER — Encounter: Payer: Self-pay | Admitting: Surgery

## 2014-04-13 ENCOUNTER — Encounter: Payer: Self-pay | Admitting: Surgery

## 2014-04-13 ENCOUNTER — Ambulatory Visit (INDEPENDENT_AMBULATORY_CARE_PROVIDER_SITE_OTHER): Payer: Medicare Other | Admitting: Surgery

## 2014-04-13 ENCOUNTER — Ambulatory Visit (HOSPITAL_COMMUNITY)
Admission: RE | Admit: 2014-04-13 | Discharge: 2014-04-13 | Disposition: A | Discharge status: Home / Self Care | Payer: Medicare Other | Source: Ambulatory Visit | Attending: Surgery | Admitting: Surgery

## 2014-04-13 VITALS — BP 151/61 | HR 67 | Ht 70.0 in | Wt 203.0 lb

## 2014-04-13 DIAGNOSIS — I714 Abdominal aortic aneurysm, without rupture, unspecified: Secondary | ICD-10-CM | POA: Insufficient documentation

## 2014-04-13 DIAGNOSIS — I739 Peripheral vascular disease, unspecified: Secondary | ICD-10-CM

## 2014-04-13 DIAGNOSIS — I7789 Other specified disorders of arteries and arterioles: Secondary | ICD-10-CM

## 2014-04-13 NOTE — Progress Notes (Signed)
Patient name: [name] MRN: [id number] DOB: 07/15/1935 Sex: male     Chief Complaint  Patient presents with  . Re-evaluation    3 month f/u    HISTORY OF PRESENT ILLNESS: Today for followup of his bilateral claudication.  On his initial visit, he complained of bilateral thigh and calf cramping that was brought on by exercise and alleviated with rest.  His symptoms occurred at approximately 100 feet.  His left leg bothers him more than the right.  He had a CT scan that showed a 3.7 cm aneurysm as well as a chronic left common iliac dissection.  I elected to treat him medically, and I added cilostazol to his medical regimen.  The patient was unable to tolerate the cilostazol.  He states that his symptoms are no worse today than they were 3 months ago.  He feels that he is actually walking a little bit further.  He has no open wounds.  Past Medical History  Diagnosis Date  . CAD (coronary artery disease)     s/p cabg\htn  . HLD (hyperlipidemia)   . Myocardial infarction   . Hypertension   . Diabetes mellitus without complication   . Arthritis   . Anemia     Past Surgical History  Procedure Laterality Date  . Cardiac catheterization  2004  . Coronary artery bypass graft  1992  . Appendectomy    . Back surgery    . Knee surgery      History   Social History  . Marital Status: Married    Spouse Name: N/A    Number of Children: 2  . Years of Education: N/A   Occupational History  . Retired    Social History Main Topics  . Smoking status: Former Smoker -- 40 years    Quit date: 09/06/1990  . Smokeless tobacco: Current User    Types: Chew  . Alcohol Use: No  . Drug Use: No  . Sexual Activity: Not on file   Other Topics Concern  . Not on file   Social History Narrative  . No narrative on file    Family History  Problem Relation Age of Onset  . Heart disease    . Depression Father     Allergies as of 04/13/2014 - Review Complete 04/13/2014  Allergen  Reaction Noted  . Statins Other (See Comments) 08/01/2011    Current Outpatient Prescriptions on File Prior to Visit  Medication Sig Dispense Refill  . amLODipine (NORVASC) 10 MG tablet Take 10 mg by mouth daily.      Marland Kitchen. aspirin 325 MG tablet Take 325 mg by mouth 3 (three) times a week.       . fish oil-omega-3 fatty acids 1000 MG capsule Take 2 g by mouth 2 (two) times daily.        . Garlic 1000 MG CAPS Take 2 capsules by mouth 2 (two) times daily.        Marland Kitchen. glipiZIDE (GLUCOTROL XL) 2.5 MG 24 hr tablet Take 2.5 mg by mouth daily.      Marland Kitchen. lisinopril (PRINIVIL,ZESTRIL) 20 MG tablet Take 1 tablet by mouth daily.      . cilostazol (PLETAL) 100 MG tablet Take 1 tablet (100 mg total) by mouth 2 (two) times daily before a meal.  60 tablet  5  . cyclobenzaprine (FLEXERIL) 10 MG tablet Take 1 tablet (10 mg total) by mouth 3 (three) times daily as needed for muscle spasms.  50 tablet  1  . docusate sodium 100 MG CAPS Take 100 mg by mouth 2 (two) times daily.  60 capsule  0  . nitroGLYCERIN (NITROSTAT) 0.4 MG SL tablet Place 0.4 mg under the tongue every 5 (five) minutes as needed.        Marland Kitchen. oxyCODONE-acetaminophen (PERCOCET/ROXICET) 5-325 MG per tablet Take 1-2 tablets by mouth every 4 (four) hours as needed.  100 tablet  0   No current facility-administered medications on file prior to visit.     REVIEW OF SYSTEMS: Please see history of present illness, otherwise all systems are unchanged from visit 3 months ago  PHYSICAL EXAMINATION:   Vital signs are BP 151/61  Pulse 67  Ht 5\' 10"  (1.778 m)  Wt 203 lb (92.08 kg)  BMI 29.13 kg/m2  SpO2 98% General: The patient appears their stated age. HEENT:  No gross abnormalities Pulmonary:  Non labored breathing Abdomen: Soft and non-tender Musculoskeletal: There are no major deformities. Neurologic: No focal weakness or paresthesias are detected, Skin: There are no ulcer or rashes noted. Psychiatric: The patient has normal affect. Cardiovascular:  There is a regular rate and rhythm without significant murmur appreciated.  No carotid bruits.   Diagnostic Studies I ordered and reviewed his ultrasound studies today.  Ultrasound of the aorta showed a maximum diameter of 3.3 cm.  ABI on the right was 0.7 and on the left was 0.78.  Both waveforms are monophasic.  Assessment: #1: Abdominal aortic aneurysm #2: Peripheral vascular disease with claudication Plan: #1: I will plan on repeating his abdominal ultrasound in one year.  He is at very low risk for rupture at this current size. #2: We again discussed the signs and symptoms for intervention in his lower extremity occlusive disease.  I do not feel that his symptoms are severe enough to warrant intervention at this time.  The patient is in full agreement.  He will followup in one year with a repeat ABI  V. Charlena CrossWells Brabham IV, M.D. [facility] [place] Office: [phone] Pager:  [phone]

## 2014-04-13 NOTE — Addendum Note (Signed)
Addended by: Sharee PimpleMCCHESNEY, Keimani Laufer K on: 04/13/2014 10:37 AM   Modules accepted: Orders

## 2014-05-13 ENCOUNTER — Other Ambulatory Visit (HOSPITAL_COMMUNITY)
Admission: RE | Admit: 2014-05-13 | Disposition: A | Discharge status: Home / Self Care | Payer: Medicare Other | Source: Ambulatory Visit | Attending: Oncology | Admitting: Oncology

## 2014-05-13 LAB — BONE MARROW EXAM: Bone Marrow Exam: 403

## 2014-05-22 LAB — CHROMOSOME ANALYSIS, BONE MARROW

## 2014-07-29 ENCOUNTER — Encounter (HOSPITAL_COMMUNITY): Payer: Self-pay

## 2014-10-04 DEATH — deceased

## 2014-12-06 IMAGING — CR DG CHEST 2V
2 series · 2 of 2 positions shown · non-contrast
Comparison: 02/01/2010

CLINICAL DATA: Preoperative examination (low back surgery)

CHEST - 2 VIEW

[view not recorded (1 of 2)]
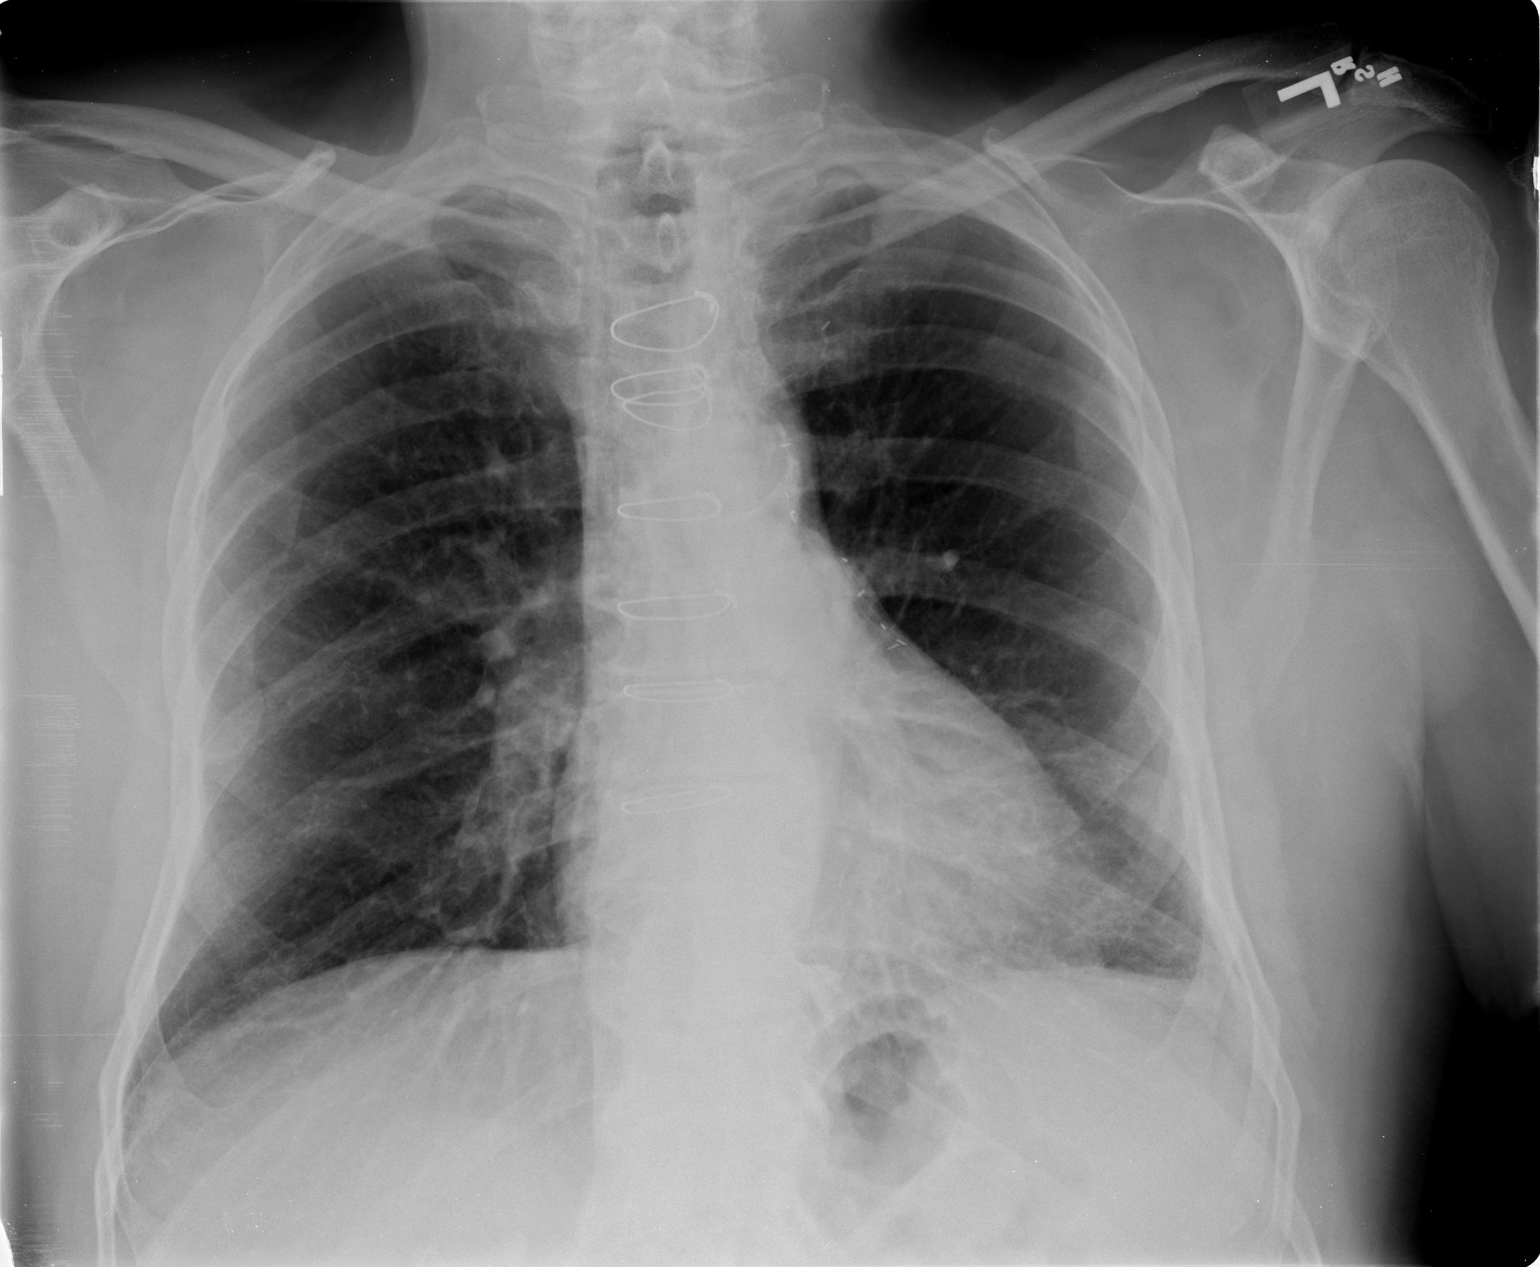

[view not recorded (2 of 2)]
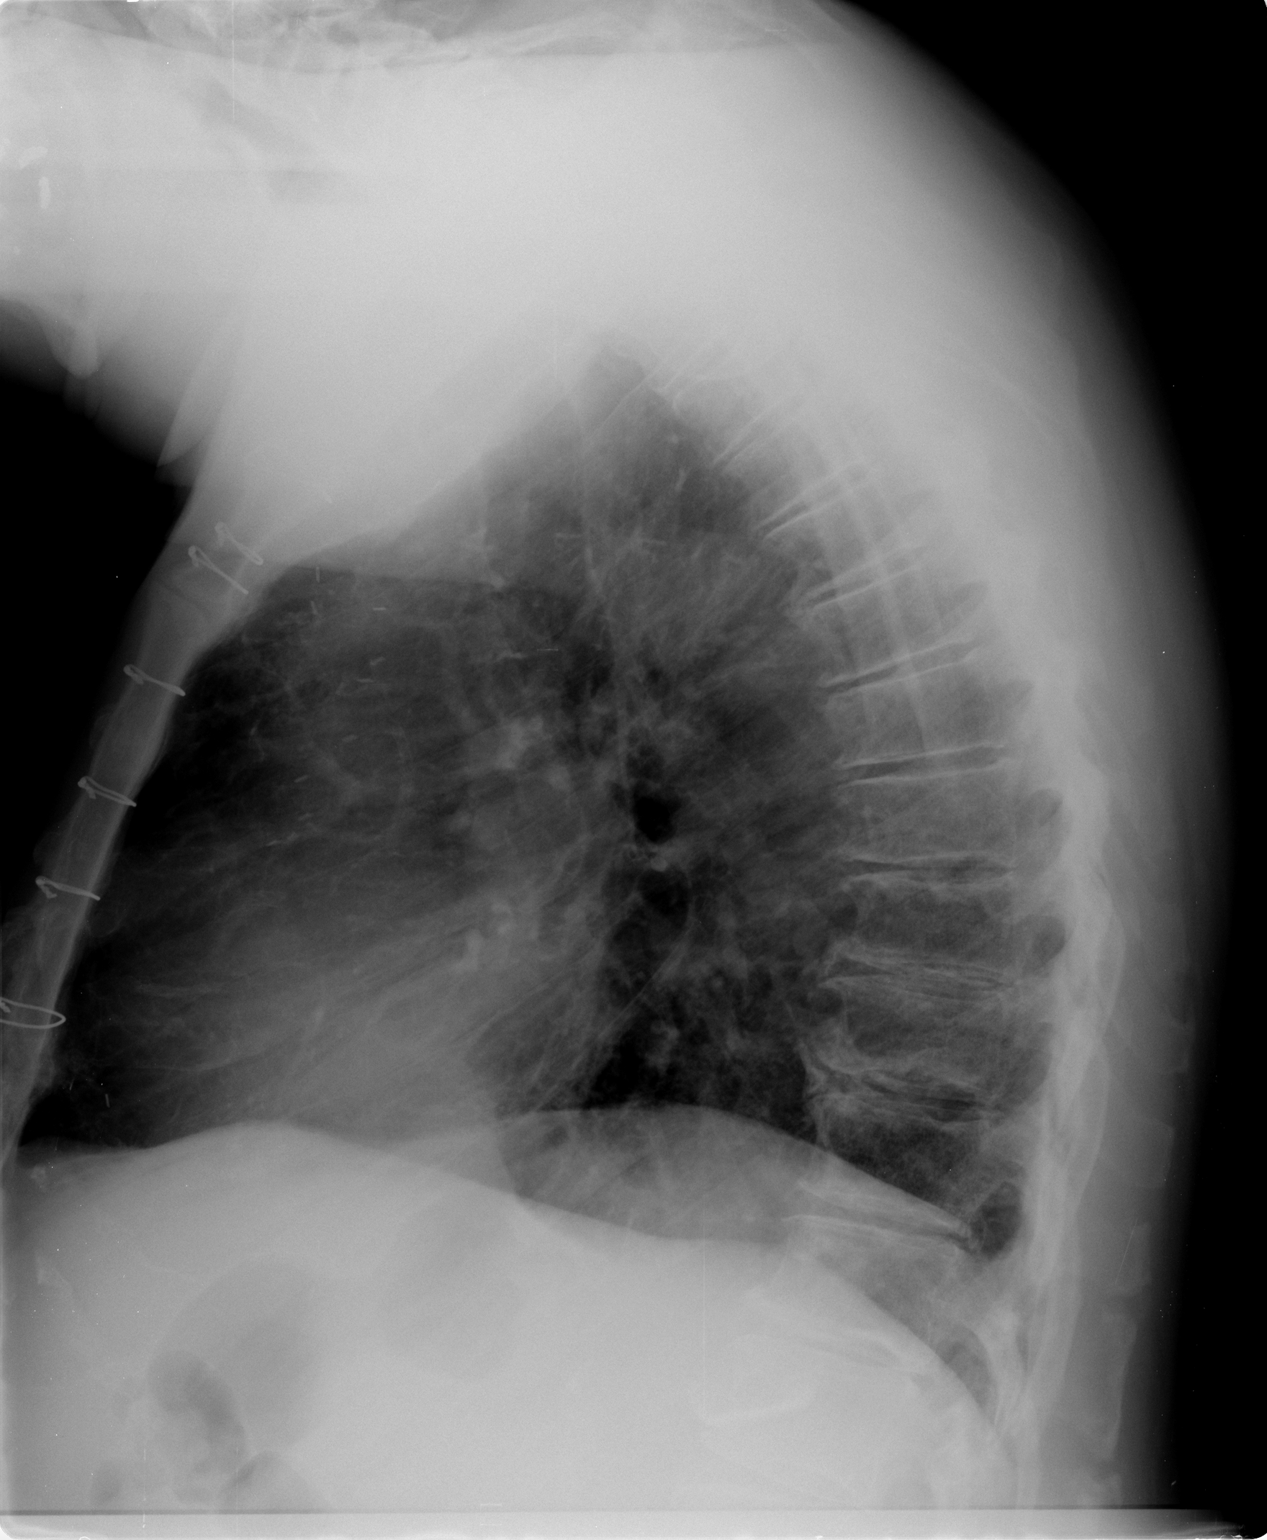

[2 of 2 positions shown; findings below may reference images not displayed]

FINDINGS: Grossly unchanged cardiac silhouette and mediastinal contours post
median sternotomy.  Atherosclerotic calcifications within the
thoracic aorta.  The lungs appear hyperexpanded with flattening of
bilateral hemidiaphragms mild diffuse thickening of the
interstitium.  There is chronic blunting of the left costophrenic
angle without definite pleural effusion.  No pneumothorax.  No
evidence of edema.  Unchanged bones.
IMPRESSION: 1.  Hyperexpanded lungs without acute cardiopulmonary disease.
2.  Chronic blunting of the left costophrenic angle favored to be
secondary to atelectasis or scar.

## 2015-03-29 ENCOUNTER — Telehealth: Payer: Self-pay | Admitting: Family

## 2015-03-29 NOTE — Telephone Encounter (Signed)
Received a call from patients wife. Mr [name] passed away in 09/2014. All appointments have been canceled, dpm

## 2015-04-19 ENCOUNTER — Other Ambulatory Visit (HOSPITAL_COMMUNITY): Payer: Medicare Other

## 2015-04-19 ENCOUNTER — Encounter (HOSPITAL_COMMUNITY): Payer: Medicare Other

## 2015-04-19 ENCOUNTER — Encounter: Payer: Medicare Other | Admitting: Family
